# Patient Record
Sex: Male | Born: 1965 | Race: White | Hispanic: No | Marital: Married | Smoking: Never smoker
Health system: Southern US, Community
[De-identification: ages and names within clinical notes are randomized; demographics above are authoritative.]

## PROBLEM LIST (undated history)

## (undated) DIAGNOSIS — I10 Essential (primary) hypertension: Secondary | ICD-10-CM

## (undated) DIAGNOSIS — F419 Anxiety disorder, unspecified: Secondary | ICD-10-CM

## (undated) HISTORY — DX: Essential (primary) hypertension: I10

## (undated) HISTORY — DX: Anxiety disorder, unspecified: F41.9

---

## 2005-10-24 ENCOUNTER — Ambulatory Visit (HOSPITAL_COMMUNITY): Admission: RE | Admit: 2005-10-24 | Discharge: 2005-10-24 | Payer: Self-pay | Admitting: Family Medicine

## 2012-10-20 DIAGNOSIS — S381XXA Crushing injury of abdomen, lower back, and pelvis, initial encounter: Secondary | ICD-10-CM | POA: Insufficient documentation

## 2012-10-20 DIAGNOSIS — I10 Essential (primary) hypertension: Secondary | ICD-10-CM | POA: Insufficient documentation

## 2012-10-20 DIAGNOSIS — S32519A Fracture of superior rim of unspecified pubis, initial encounter for closed fracture: Secondary | ICD-10-CM | POA: Insufficient documentation

## 2012-10-20 DIAGNOSIS — S329XXA Fracture of unspecified parts of lumbosacral spine and pelvis, initial encounter for closed fracture: Secondary | ICD-10-CM | POA: Insufficient documentation

## 2012-10-20 DIAGNOSIS — R739 Hyperglycemia, unspecified: Secondary | ICD-10-CM | POA: Insufficient documentation

## 2012-10-20 DIAGNOSIS — E872 Acidosis, unspecified: Secondary | ICD-10-CM | POA: Insufficient documentation

## 2012-10-20 DIAGNOSIS — E876 Hypokalemia: Secondary | ICD-10-CM | POA: Insufficient documentation

## 2012-10-20 DIAGNOSIS — R58 Hemorrhage, not elsewhere classified: Secondary | ICD-10-CM | POA: Insufficient documentation

## 2012-10-20 DIAGNOSIS — S32309A Unspecified fracture of unspecified ilium, initial encounter for closed fracture: Secondary | ICD-10-CM | POA: Insufficient documentation

## 2012-10-20 DIAGNOSIS — D72829 Elevated white blood cell count, unspecified: Secondary | ICD-10-CM | POA: Insufficient documentation

## 2012-10-20 DIAGNOSIS — S7292XA Unspecified fracture of left femur, initial encounter for closed fracture: Secondary | ICD-10-CM | POA: Insufficient documentation

## 2012-10-20 DIAGNOSIS — F419 Anxiety disorder, unspecified: Secondary | ICD-10-CM | POA: Insufficient documentation

## 2012-10-20 DIAGNOSIS — S32599A Other specified fracture of unspecified pubis, initial encounter for closed fracture: Secondary | ICD-10-CM | POA: Insufficient documentation

## 2016-09-10 ENCOUNTER — Ambulatory Visit (INDEPENDENT_AMBULATORY_CARE_PROVIDER_SITE_OTHER): Payer: BLUE CROSS/BLUE SHIELD | Admitting: Physician Assistant

## 2016-09-10 ENCOUNTER — Encounter: Payer: Self-pay | Admitting: Physician Assistant

## 2016-09-10 VITALS — BP 130/84 | HR 73 | Temp 98.3°F | Resp 14 | Ht 75.0 in | Wt 194.6 lb

## 2016-09-10 DIAGNOSIS — F411 Generalized anxiety disorder: Secondary | ICD-10-CM | POA: Diagnosis not present

## 2016-09-10 DIAGNOSIS — Z7689 Persons encountering health services in other specified circumstances: Secondary | ICD-10-CM | POA: Diagnosis not present

## 2016-09-10 MED ORDER — ALPRAZOLAM 0.5 MG PO TABS: 0.5000 mg | ORAL_TABLET | Freq: Three times a day (TID) | ORAL | 2 refills | Status: DC

## 2016-09-10 NOTE — Progress Notes (Signed)
    Patient ID: Mathew Larson MRN: [id number], DOB: 08/08/1965, 51 y.o. Date of Encounter: 09/10/2016, 11:12 AM    Chief Complaint:  Chief Complaint  Patient presents with  . New Patient (Initial Visit)     HPI: 51 y.o. year old male presents as a new patient to Establish Care.   States that he has lived in the area for over 20 years. States that he was seeing Dr. Regin[facility]ough at [facility]. Dr Regino SchultzeMcGough recently retired. Says that the replacement Dr. his already left. Had been trying for months to get an appointment there. Finally decided to just change and start coming here.  States that the only medicine he is on is alprazolam. Says they would give him #90+2 refills and that would last him a full year. Says that he really only takes a half of a tablet daily. Occasionally will take that half a tablet twice a day.  Is on no other medication.  Would go there once a year for CPE and refill on med. 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  Last CPE sometime last "Spring" but not sure what month.  No other concerns to address today.     Home Meds:   No outpatient prescriptions prior to visit.   No facility-administered medications prior to visit.     Allergies: No Known Allergies    Review of Systems: See HPI for pertinent ROS. All other ROS negative.    Physical Exam: Blood pressure 130/84, pulse 73, temperature 98.3 F (36.8 C), temperature source Oral, resp. rate 14, height 6\' 3"  (1.905 m), weight 194 lb 9.6 oz (88.3 kg), SpO2 98 %., Body mass index is 24.32 kg/m. General:  WNWD Male. Appears in no acute distress. Neck: Supple. No thyromegaly. No lymphadenopathy. Lungs: Clear bilaterally to auscultation without wheezes, rales, or rhonchi. Breathing is unlabored. Heart: Regular rhythm. No murmurs, rubs, or gallops. Msk:  Strength and tone normal for age. Extremities/Skin: Warm and dry. Neuro:  Alert and oriented X 3. Moves all extremities spontaneously. Gait is normal. CNII-XII grossly in tact. Psych:  Responds to questions appropriately with a normal affect.     ASSESSMENT AND PLAN:  51 y.o. year old male with  1. Generalized anxiety disorder Rx printed. - ALPRAZolam (XANAX) 0.5 MG tablet; Take 1 tablet (0.5 mg total) by mouth 3 (three) times daily.  Dispense: 90 tablet; Refill: 2  He wants to have his records sent here so he did sign a release today to do so. He will check his records and then once it has been a full year, will schedule complete physical exam.  Signed, Frazier RichardsMary Beth Shaela Boer, [place], [facility] 09/10/2016 11:12 AM

## 2018-02-18 ENCOUNTER — Other Ambulatory Visit: Payer: Self-pay

## 2018-02-18 ENCOUNTER — Ambulatory Visit (INDEPENDENT_AMBULATORY_CARE_PROVIDER_SITE_OTHER): Payer: Managed Care, Other (non HMO) | Admitting: Physician Assistant

## 2018-02-18 ENCOUNTER — Encounter: Payer: Self-pay | Admitting: Physician Assistant

## 2018-02-18 VITALS — BP 142/100 | HR 77 | Temp 98.0°F | Resp 16 | Ht 75.0 in | Wt 199.4 lb

## 2018-02-18 DIAGNOSIS — F411 Generalized anxiety disorder: Secondary | ICD-10-CM | POA: Diagnosis not present

## 2018-02-18 DIAGNOSIS — I10 Essential (primary) hypertension: Secondary | ICD-10-CM | POA: Diagnosis not present

## 2018-02-18 DIAGNOSIS — Z0000 Encounter for general adult medical examination without abnormal findings: Secondary | ICD-10-CM | POA: Diagnosis not present

## 2018-02-18 MED ORDER — ALPRAZOLAM 0.5 MG PO TABS: 0.5000 mg | ORAL_TABLET | Freq: Three times a day (TID) | ORAL | 2 refills | Status: DC

## 2018-02-18 MED ORDER — LISINOPRIL 10 MG PO TABS: 10.0000 mg | ORAL_TABLET | Freq: Every day | ORAL | 0 refills | Status: DC

## 2018-02-18 NOTE — Progress Notes (Signed)
Patient ID: Mathew Larson MRN: [id number], DOB: 09-30-1965 52 y.o. Date of Encounter: 02/18/2018, 11:31 AM    Chief Complaint: Physical (CPE)   09/10/2016:  HPI: 52 y.o. year old male presents as a new patient to Establish Care.   States that he has lived in the area for over 20 years. States that he was seeing Dr. Regin[facility]Mc[facility]ough at [facility]. Dr Regino SchultzeMcGough recently retired. Says that the replacement Dr. his already left. Had been trying for months to get an appointment there. Finally decided to just change and start coming here.  States that the only medicine he is on is alprazolam. Says they would give him #90+2 refills and that would last him a full year. Says that he really only takes a half of a tablet daily. Occasionally will take that half a tablet twice a day.  Is on no other medication.  Would go there once a year for CPE and refill on med. 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  Last CPE sometime last "Spring" but not sure what month.  No other concerns to address today.    02/18/2018: He presents for routine follow-up visit regarding his anxiety and to get refill on his alprazolam. During visit -- he notes that nurse reported that his blood pressure is reading high today. He states that in the past sometimes his blood pressure would read high-- but then at recheck it would be lower. Also reports that in the past at one point he was on a blood pressure medication but it made him feel bad so it was discontinued.  Says that he has no idea the name of the medicine.   Says that he just remembers that it made him feel like he was in a fog and "just did not feel good--- in general." He is concerned about his blood pressure reading high and wants to go ahead and start on a low dose medication.  He then notes that he does have a lot of stress and in general feels like he is a stressed person.   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   Says that  "with that work it seems like if anything can go wrong, that it does ".   Also states that he is having to deal with his father getting older.   Says that he went through this with his wife caring for her parents but they have now passed.   Says now it is his turn to take care of his father.  Says that it is stressful to see that things are not being done the way they should be and also notes that frequently his father will not listen to him and is not compliant with his trying to be helpful.  States that the alprazolam is working well.  Says that he takes that each morning and then occasionally needs to take it later in the day as well.  Also notes that he came fasting today so that we could maybe check labs etc. Reviewed with him that I never got records from his prior provider but definitely has been over a  year since last CPE so he wants to go ahead and update preventive care while he is here.  No other specific concerns to address.       Review of Systems: Consitutional: No fever, chills, fatigue, night sweats, lymphadenopathy, or weight changes.  Eyes: No visual changes, eye redness, or discharge. ENT/Mouth: Ears: No otalgia, tinnitus, hearing loss, discharge. Nose: No congestion, rhinorrhea, sinus pain, or epistaxis. Throat: No sore throat, post nasal drip, or teeth pain. Cardiovascular: No CP, palpitations, diaphoresis, DOE, edema, orthopnea, PND. Respiratory: No cough, hemoptysis, SOB, or wheezing. Gastrointestinal: No anorexia, dysphagia, reflux, pain, nausea, vomiting, hematemesis, diarrhea, constipation, BRBPR, or melena. Genitourinary: No dysuria, frequency, urgency, hematuria, incontinence, nocturia, decreased urinary stream, discharge, impotence, or testicular pain/masses. Musculoskeletal: No decreased ROM, myalgias, stiffness, joint swelling, or weakness. Skin: No rash, erythema, lesion changes, pain, warmth, jaundice, or  pruritis. Neurological: No headache, dizziness, syncope, seizures, tremors, memory loss, coordination problems, or paresthesias. Psychological: No depression, hallucinations, SI/HI. Endocrine: No fatigue, polydipsia, polyphagia, polyuria, or known diabetes. All other systems were reviewed and are otherwise negative.  Past Medical History:  Diagnosis Date  . Anxiety   . Hypertension      History reviewed. No pertinent surgical history.  Home Meds:  Outpatient Medications Prior to Visit  Medication Sig Dispense Refill  . ALPRAZolam (XANAX) 0.5 MG tablet Take 1 tablet (0.5 mg total) by mouth 3 (three) times daily. 90 tablet 2   No facility-administered medications prior to visit.     Allergies: No Known Allergies  Social History   Socioeconomic History  . Marital status: Married    Spouse name: Not on file  . Number of children: Not on file  . Years of education: Not on file  . Highest education level: Not on file  Occupational History  . Not on file  Social Needs  . Financial resource strain: Not on file  . Food insecurity:    Worry: Not on file    Inability: Not on file  . Transportation needs:    Medical: Not on file    Non-medical: Not on file  Tobacco Use  . Smoking status: Never Smoker  . Smokeless tobacco: Never Used  Substance and Sexual Activity  . Alcohol use: No  . Drug use: No  . Sexual activity: Yes  Lifestyle  . Physical activity:    Days per week: Not on file    Minutes per session: Not on file  . Stress: Not on file  Relationships  . Social connections:    Talks on phone: Not on file    Gets together: Not on file    Attends religious service: Not on file    Active member of club or organization: Not on file    Attends meetings of clubs or organizations: Not on file    Relationship status: Not on file  . Intimate partner violence:    Fear of current or ex partner: Not on file    Emotionally abused: Not on file    Physically abused: Not on  file    Forced sexual activity: Not on file  Other Topics Concern  . Not on file  Social History Narrative  . Not on file    Family History  Problem Relation Age of Onset  . Alcohol abuse Mother   . Cancer Paternal Grandfather     Physical Exam: Blood pressure (!) 142/100, pulse 77, temperature 98 F (36.7 C), temperature source Oral, resp. rate 16, height 6\' 3"  (1.905 m), weight 90.4 kg, SpO2 97 %.  General: Well developed, well nourished Male. Appears in no acute distress. HEENT: Normocephalic, atraumatic. Conjunctiva pink, sclera non-icteric. Pupils 2 mm constricting to 1 mm, round, regular, and equally reactive to light and accomodation. EOMI. Internal auditory canal clear.  TMs with good cone of light and without pathology. Nasal mucosa pink. Nares are without discharge. No sinus tenderness. Oral mucosa pink. Neck: Supple. Trachea midline. No thyromegaly. Full ROM. No lymphadenopathy. Lungs: Clear to auscultation bilaterally without wheezes, rales, or rhonchi. Breathing is of normal effort and unlabored. Cardiovascular: RRR with S1 S2. No murmurs, rubs, or gallops. Distal pulses 2+ symmetrically. No carotid or abdominal bruits. Abdomen: Soft, non-tender, non-distended with normoactive bowel sounds. No hepatosplenomegaly or masses. No rebound/guarding. No CVA tenderness. No hernias. Musculoskeletal: Full range of motion and 5/5 strength throughout.  Skin: Warm and moist without erythema, ecchymosis, wounds, or rash. Neuro: A+Ox3. CN II-XII grossly intact. Moves all extremities spontaneously. Full sensation throughout. Normal gait.  Psych:  Responds to questions appropriately with a normal affect.   Assessment/Plan:  52 y.o. y/o  male here for CPE   -1. Encounter for preventive health examination 02/18/2018: A. Screening Labs:  - CBC with Differential/Platelet - COMPLETE METABOLIC PANEL WITH GFR - Lipid panel  B. Screening For Prostate Cancer: No indication to require this  until age 60  C. Screening For Colorectal Cancer:  No indication to require this until age 35  D. Immunizations: Flu-----------------------N/A Tetanus------------- reports that he received a tetanus vaccine in 2014.  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 Pneumococcal---- He has no indication to require pneumonia vaccine until age 31 Shingrix---------- Will discuss at age 19    2. Generalized anxiety disorder 02/18/2018:Stable /controlled.  Continue alprazolam as needed.  3. Essential hypertension 02/18/2018:Blood pressure is elevated.  He reports that he has had history of elevated blood pressure in the past.  He is concerned about this and does want to go ahead and start low-dose blood pressure medication.  Will start lisinopril 10 mg once a day he is to take in the morning.  Is to call here if he thinks this is causing adverse effects.  Otherwise he will continue taking 1 each morning and return for follow-up visit in 2 weeks. - lisinopril (PRINIVIL,ZESTRIL) 10 MG tablet; Take 1 tablet (10 mg total) by mouth daily.  Dispense: 30 tablet; Refill: 0     Signed:   [address] [place], [place]  02/18/2018 11:31 AM

## 2018-02-19 LAB — CBC WITH DIFFERENTIAL/PLATELET
BASOS PCT: 0.7 %
Basophils Absolute: 32 cells/uL (ref 0–200)
EOS ABS: 99 {cells}/uL (ref 15–500)
Eosinophils Relative: 2.2 %
HCT: 44.2 % (ref 38.5–50.0)
Hemoglobin: 14.9 g/dL (ref 13.2–17.1)
Lymphs Abs: 1260 cells/uL (ref 850–3900)
MCH: 30.3 pg (ref 27.0–33.0)
MCHC: 33.7 g/dL (ref 32.0–36.0)
MCV: 90 fL (ref 80.0–100.0)
MPV: 11 fL (ref 7.5–12.5)
Monocytes Relative: 11.5 %
Neutro Abs: 2592 cells/uL (ref 1500–7800)
Neutrophils Relative %: 57.6 %
PLATELETS: 247 10*3/uL (ref 140–400)
RBC: 4.91 10*6/uL (ref 4.20–5.80)
RDW: 12.5 % (ref 11.0–15.0)
TOTAL LYMPHOCYTE: 28 %
WBC mixed population: 518 cells/uL (ref 200–950)
WBC: 4.5 10*3/uL (ref 3.8–10.8)

## 2018-02-19 LAB — COMPLETE METABOLIC PANEL WITH GFR
AG Ratio: 1.8 (calc) (ref 1.0–2.5)
ALKALINE PHOSPHATASE (APISO): 54 U/L (ref 40–115)
ALT: 18 U/L (ref 9–46)
AST: 16 U/L (ref 10–40)
Albumin: 4.2 g/dL (ref 3.6–5.1)
BUN: 16 mg/dL (ref 7–25)
CALCIUM: 9 mg/dL (ref 8.6–10.3)
CO2: 27 mmol/L (ref 20–32)
Chloride: 105 mmol/L (ref 98–110)
Creat: 0.98 mg/dL (ref 0.60–1.35)
GFR, EST NON AFRICAN AMERICAN: 90 mL/min/{1.73_m2} (ref >=60)
GFR, Est African American: 104 mL/min/{1.73_m2} (ref >=60)
Globulin: 2.4 g/dL (calc) (ref 1.9–3.7)
Glucose, Bld: 95 mg/dL (ref 65–99)
Potassium: 4.4 mmol/L (ref 3.5–5.3)
Sodium: 140 mmol/L (ref 135–146)
Total Bilirubin: 0.5 mg/dL (ref 0.2–1.2)
Total Protein: 6.6 g/dL (ref 6.1–8.1)

## 2018-02-19 LAB — LIPID PANEL
Cholesterol: 169 mg/dL (ref <200)
HDL: 42 mg/dL (ref >40)
LDL Cholesterol (Calc): 109 mg/dL (calc) — ABNORMAL HIGH
NON-HDL CHOLESTEROL (CALC): 127 mg/dL (ref <130)
Total CHOL/HDL Ratio: 4 (calc) (ref <5.0)
Triglycerides: 88 mg/dL (ref <150)

## 2018-03-24 ENCOUNTER — Encounter: Payer: Self-pay | Admitting: Physician Assistant

## 2018-03-24 ENCOUNTER — Ambulatory Visit (INDEPENDENT_AMBULATORY_CARE_PROVIDER_SITE_OTHER): Payer: Managed Care, Other (non HMO) | Admitting: Physician Assistant

## 2018-03-24 VITALS — BP 140/102 | HR 74 | Temp 98.0°F | Resp 16 | Ht 75.0 in | Wt 199.4 lb

## 2018-03-24 DIAGNOSIS — I10 Essential (primary) hypertension: Secondary | ICD-10-CM

## 2018-03-24 MED ORDER — LISINOPRIL 20 MG PO TABS: 20.0000 mg | ORAL_TABLET | Freq: Every day | ORAL | 0 refills | Status: DC

## 2018-03-24 NOTE — Progress Notes (Signed)
Patient ID: Mathew Larson MRN: [id number], DOB: May 08, 1966 52 y.o. Date of Encounter: 03/24/2018, 3:12 PM    Chief Complaint: Physical (CPE)   09/10/2016:  HPI: 52 y.o. year old male presents as a new patient to Establish Care.   States that he has lived in the area for over 20 years. States that he was seeing Dr. Regin[facility]ough at [facility]. Dr Regino SchultzeMcGough recently retired. Says that the replacement Dr. his already left. Had been trying for months to get an appointment there. Finally decided to just change and start coming here.  States that the only medicine he is on is alprazolam. Says they would give him #90+2 refills and that would last him a full year. Says that he really only takes a half of a tablet daily. Occasionally will take that half a tablet twice a day.  Is on no other medication.  Would go there once a year for CPE and refill on med. 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  Last CPE sometime last "Spring" but not sure what month.  No other concerns to address today.    02/18/2018: He presents for routine follow-up visit regarding his anxiety and to get refill on his alprazolam. During visit -- he notes that nurse reported that his blood pressure is reading high today. He states that in the past sometimes his blood pressure would read high-- but then at recheck it would be lower. Also reports that in the past at one point he was on a blood pressure medication but it made him feel bad so it was discontinued.  Says that he has no idea the name of the medicine.   Says that he just remembers that it made him feel like he was in a fog and "just did not feel good--- in general." He is concerned about his blood pressure reading high and wants to go ahead and start on a low dose medication.  He then notes that he does have a lot of stress and in general feels like he is a stressed person.   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   Says that  "with that work it seems like if anything can go wrong, that it does ".   Also states that he is having to deal with his father getting older.   Says that he went through this with his wife caring for her parents but they have now passed.   Says now it is his turn to take care of his father.  Says that it is stressful to see that things are not being done the way they should be and also notes that frequently his father will not listen to him and is not compliant with his trying to be helpful.  States that the alprazolam is working well.  Says that he takes that each morning and then occasionally needs to take it later in the day as well.  Also notes that he came fasting today so that we could maybe check labs etc. Reviewed with him that I never got records from his prior provider but definitely has been over a  year since last CPE so he wants to go ahead and update preventive care while he is here.  No other specific concerns to address.  AT THAT OV: Updated preventive care/CPE.  Check fasting labs. Started lisinopril 10 mg daily for hypertension.  Plan  for follow-up visit 2 weeks.     03/24/2018: Today he reports that he is taking the lisinopril 10 mg daily.  States that at first he was taking it in the morning but it was making him feel tired so he started taking it at night.  Is working well for him and is causing no adverse effects with taking it at night.     Review of Systems: Consitutional: No fever, chills, fatigue, night sweats, lymphadenopathy, or weight changes. Eyes: No visual changes, eye redness, or discharge. ENT/Mouth: Ears: No otalgia, tinnitus, hearing loss, discharge. Nose: No congestion, rhinorrhea, sinus pain, or epistaxis. Throat: No sore throat, post nasal drip, or teeth pain. Cardiovascular: No CP, palpitations, diaphoresis, DOE, edema, orthopnea, PND. Respiratory: No cough, hemoptysis, SOB, or  wheezing. Gastrointestinal: No anorexia, dysphagia, reflux, pain, nausea, vomiting, hematemesis, diarrhea, constipation, BRBPR, or melena. Genitourinary: No dysuria, frequency, urgency, hematuria, incontinence, nocturia, decreased urinary stream, discharge, impotence, or testicular pain/masses. Musculoskeletal: No decreased ROM, myalgias, stiffness, joint swelling, or weakness. Skin: No rash, erythema, lesion changes, pain, warmth, jaundice, or pruritis. Neurological: No headache, dizziness, syncope, seizures, tremors, memory loss, coordination problems, or paresthesias. Psychological: No depression, hallucinations, SI/HI. Endocrine: No fatigue, polydipsia, polyphagia, polyuria, or known diabetes. All other systems were reviewed and are otherwise negative.  Past Medical History:  Diagnosis Date  . Anxiety   . Hypertension      History reviewed. No pertinent surgical history.  Home Meds:  Outpatient Medications Prior to Visit  Medication Sig Dispense Refill  . ALPRAZolam (XANAX) 0.5 MG tablet Take 1 tablet (0.5 mg total) by mouth 3 (three) times daily. 90 tablet 2  . lisinopril (PRINIVIL,ZESTRIL) 10 MG tablet Take 1 tablet (10 mg total) by mouth daily. 30 tablet 0   No facility-administered medications prior to visit.     Allergies: No Known Allergies  Social History   Socioeconomic History  . Marital status: Married    Spouse name: Not on file  . Number of children: Not on file  . Years of education: Not on file  . Highest education level: Not on file  Occupational History  . Not on file  Social Needs  . Financial resource strain: Not on file  . Food insecurity:    Worry: Not on file    Inability: Not on file  . Transportation needs:    Medical: Not on file    Non-medical: Not on file  Tobacco Use  . Smoking status: Never Smoker  . Smokeless tobacco: Never Used  Substance and Sexual Activity  . Alcohol use: No  . Drug use: No  . Sexual activity: Yes  Lifestyle   . Physical activity:    Days per week: Not on file    Minutes per session: Not on file  . Stress: Not on file  Relationships  . Social connections:    Talks on phone: Not on file    Gets together: Not on file    Attends religious service: Not on file    Active member of club or organization: Not on file    Attends meetings of clubs or organizations: Not on file    Relationship status: Not on file  . Intimate partner violence:    Fear of current or ex partner: Not on file    Emotionally abused: Not on file    Physically abused: Not on file    Forced sexual activity: Not on file  Other Topics Concern  . Not on file  Social History  Narrative  . Not on file    Family History  Problem Relation Age of Onset  . Alcohol abuse Mother   . Cancer Paternal Grandfather     Physical Exam: Blood pressure (!) 140/102, pulse 74, temperature 98 F (36.7 C), temperature source Oral, resp. rate 16, height 6\' 3"  (1.905 m), weight 90.4 kg, SpO2 97 %., Body mass index is 24.92 kg/m. General:  WNWD Male. Appears in no acute distress. Neck: Supple. No thyromegaly. No lymphadenopathy.  No carotid bruits. Lungs: Clear bilaterally to auscultation without wheezes, rales, or rhonchi. Breathing is unlabored. Heart: RRR with S1 S2. No murmurs, rubs, or gallops. Musculoskeletal:  Strength and tone normal for age. Extremities/Skin: Warm and dry.  Neuro: Alert and oriented X 3. Moves all extremities spontaneously. Gait is normal. CNII-XII grossly in tact. Psych:  Responds to questions appropriately with a normal affect.      Assessment/Plan:  52 y.o. y/o  male here for    Essential hypertension 02/18/2018:Blood pressure is elevated.  He reports that he has had history of elevated blood pressure in the past.  He is concerned about this and does want to go ahead and start low-dose blood pressure medication.  Will start lisinopril 10 mg once a day he is to take in the morning.  Is to call here if he  thinks this is causing adverse effects.  Otherwise he will continue taking 1 each morning and return for follow-up visit in 2 weeks. - lisinopril (PRINIVIL,ZESTRIL) 10 MG tablet; Take 1 tablet (10 mg total) by mouth daily.  Dispense: 30 tablet; Refill: 0  03/24/2018: Nurse checked blood pressure on each arm and got similar readings bilaterally.   I rechecked his blood pressure on the left arm and I am getting the systolic better at about 134 but the diastolic is reading high at about 104. Will increase lisinopril to 20 mg daily.   Will have him continue to take it at night since he felt that he had increased fatigue when he would take it in the morning.   He will have follow-up visit in 2 weeks to recheck BP and also will check BME T at that visit.   Hopefully BP will be at goal and can stretch out visits to every 6 months.   He states that he "is not surprised" that blood pressure is still high.   States that his blood pressure was high with Dr. Regino Schultze even way back around 2005.    Generalized anxiety disorder 02/18/2018:Stable /controlled.  Continue alprazolam as needed.    THE FOLLOWING IS COPIED FROM CPE NOTE 02/18/2018: -1. Encounter for preventive health examination 02/18/2018: A. Screening Labs:  - CBC with Differential/Platelet - COMPLETE METABOLIC PANEL WITH GFR - Lipid panel  B. Screening For Prostate Cancer: No indication to require this until age 29  C. Screening For Colorectal Cancer:  No indication to require this until age 3  D. Immunizations: Flu-----------------------N/A Tetanus------------- reports that he received a tetanus vaccine in 2014.  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 Pneumococcal---- He has no indication to require pneumonia vaccine until age 38 Shingrix---------- Will discuss at age 50      Signed:   [address] [place], [place]  03/24/2018 3:12 PM

## 2018-09-06 ENCOUNTER — Ambulatory Visit (INDEPENDENT_AMBULATORY_CARE_PROVIDER_SITE_OTHER): Payer: Managed Care, Other (non HMO)

## 2018-09-06 DIAGNOSIS — Z23 Encounter for immunization: Secondary | ICD-10-CM

## 2018-09-06 NOTE — Progress Notes (Signed)
Patient came in today to receive his annual flu shot. Patient was given fluarix in the left deltoid. Patient tolerated well. VIS was given.

## 2019-04-11 ENCOUNTER — Other Ambulatory Visit: Payer: Self-pay

## 2019-04-11 ENCOUNTER — Encounter: Payer: Self-pay | Admitting: Family Medicine

## 2019-04-11 ENCOUNTER — Ambulatory Visit (INDEPENDENT_AMBULATORY_CARE_PROVIDER_SITE_OTHER): Payer: Managed Care, Other (non HMO) | Admitting: Family Medicine

## 2019-04-11 VITALS — BP 146/82 | HR 90 | Temp 98.1°F | Resp 16 | Ht 75.0 in | Wt 198.0 lb

## 2019-04-11 DIAGNOSIS — Z23 Encounter for immunization: Secondary | ICD-10-CM | POA: Diagnosis not present

## 2019-04-11 DIAGNOSIS — Z125 Encounter for screening for malignant neoplasm of prostate: Secondary | ICD-10-CM | POA: Diagnosis not present

## 2019-04-11 DIAGNOSIS — I10 Essential (primary) hypertension: Secondary | ICD-10-CM | POA: Diagnosis not present

## 2019-04-11 DIAGNOSIS — F411 Generalized anxiety disorder: Secondary | ICD-10-CM | POA: Diagnosis not present

## 2019-04-11 MED ORDER — AMLODIPINE BESYLATE 2.5 MG PO TABS: 2.5000 mg | ORAL_TABLET | Freq: Every day | ORAL | 3 refills | Status: DC

## 2019-04-11 NOTE — Progress Notes (Signed)
Subjective:    Patient ID: Mathew Larson, male    DOB: April 26, 1966, 53 y.o.   MRN: [id number]  Patient presents for Follow-up (is fsating) Patient here to follow-up chronic medical problems.  This is my first time establishing care with him he is a previous patient of mine PA.  History of hypertension has had this for greater than 20 years.  Often thought it was more induced by his anxiety.  He was initially given medication last year lisinopril dose was titrated up to 20 mg but he states that it did not work well for his body he felt uncomfortable with the medication felt lightheaded and stuffy headache.  So he discontinued it.  He has not been on any other medications since then. 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  He is not able to exercise because of this.  He does try to watch his diet he does not add any salt to the cooking wife is also in the appointment.  They also do not eat any junk food or fast food.  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  During the anxiety he has had this for many years.  He is prescribed 0.5 mg of Xanax 3 times daily as needed he typically spread this out over a few months at a time.  When he does not take medication often only takes a half a tablet     No major issues with sleep     Dentist- every 6 months    Occasion history reviewed in detail  Review Of Systems:  GEN- denies fatigue, fever, weight loss,weakness, recent illness HEENT- denies eye drainage, change in vision, nasal discharge, CVS- denies chest pain, palpitations RESP- denies SOB, cough, wheeze ABD- denies N/V, change in stools, abd pain GU- denies dysuria, hematuria, dribbling, incontinence MSK- denies joint pain, muscle aches, injury Neuro- denies headache, dizziness, syncope, seizure activity       Objective:    BP (!) 146/82 (BP Location: Left Arm, Patient Position: Sitting,  Cuff Size: Normal)   Pulse 90   Temp 98.1 F (36.7 C) (Oral)   Resp 16   Ht 6\' 3"  (1.905 m)   Wt 198 lb (89.8 kg)   SpO2 98%   BMI 24.75 kg/m  GEN- NAD, alert and oriented x3 HEENT- PERRL, EOMI, non injected sclera, pink conjunctiva, MMM, oropharynx clear Neck- Supple, no thyromegaly CVS- RRR, no murmur RESP-CTAB ABD-NABS,soft,NT,ND Psych- normal affect and mood  EXT- No edema Pulses- Radial, DP- 2+        Assessment & Plan:      Problem List Items Addressed This Visit      Unprioritized   Essential hypertension - Primary    Will start amlodipine 2.5 mg once a day.  Of note also advised him to take his blood pressure to make sure that he is not having hypotensive episodes when he states he did not feel right with the previous medication.  I am to check his metabolic panel electrolytes.  He is also overdue for PSA screening so this will be done.  Flu shot was given.      Relevant Medications   amLODipine (NORVASC) 2.5 MG tablet   Other Relevant Orders   CBC with Differential/Platelet (Completed)   Comprehensive metabolic panel (Completed)   Lipid panel (Completed)   Generalized anxiety disorder    We will continue  with Xanax at this time.  We will recheck his blood pressure in about 1 to 2 months      Relevant Medications   ALPRAZolam (XANAX) 0.5 MG tablet    Other Visit Diagnoses    Prostate cancer screening       Relevant Orders   PSA (Completed)   Need for immunization against influenza       Relevant Orders   Flu Vaccine QUAD 36+ mos IM (Completed)      Note: This dictation was prepared with Dragon dictation along with smaller phrase technology. Any transcriptional errors that result from this process are unintentional.

## 2019-04-11 NOTE — Patient Instructions (Addendum)
Started norvasc 2.5mg  once a day for blood pressure  We will call with lab results Flu shot given  Cologuard  F/U 6 WEEKS FOR BLOOD PRESSURE

## 2019-04-12 ENCOUNTER — Encounter: Payer: Self-pay | Admitting: Family Medicine

## 2019-04-12 LAB — COMPREHENSIVE METABOLIC PANEL
AG Ratio: 1.8 (calc) (ref 1.0–2.5)
ALT: 28 U/L (ref 9–46)
AST: 19 U/L (ref 10–35)
Albumin: 4.5 g/dL (ref 3.6–5.1)
Alkaline phosphatase (APISO): 53 U/L (ref 35–144)
BUN: 14 mg/dL (ref 7–25)
CO2: 28 mmol/L (ref 20–32)
Calcium: 9.3 mg/dL (ref 8.6–10.3)
Chloride: 104 mmol/L (ref 98–110)
Creat: 0.96 mg/dL (ref 0.70–1.33)
Globulin: 2.5 g/dL (calc) (ref 1.9–3.7)
Glucose, Bld: 94 mg/dL (ref 65–99)
Potassium: 4.5 mmol/L (ref 3.5–5.3)
Sodium: 141 mmol/L (ref 135–146)
Total Bilirubin: 0.6 mg/dL (ref 0.2–1.2)
Total Protein: 7 g/dL (ref 6.1–8.1)

## 2019-04-12 LAB — CBC WITH DIFFERENTIAL/PLATELET
Absolute Monocytes: 602 cells/uL (ref 200–950)
Basophils Absolute: 32 cells/uL (ref 0–200)
Basophils Relative: 0.5 %
Eosinophils Absolute: 109 cells/uL (ref 15–500)
Eosinophils Relative: 1.7 %
HCT: 45.5 % (ref 38.5–50.0)
Hemoglobin: 15.3 g/dL (ref 13.2–17.1)
Lymphs Abs: 1318 cells/uL (ref 850–3900)
MCH: 30.4 pg (ref 27.0–33.0)
MCHC: 33.6 g/dL (ref 32.0–36.0)
MCV: 90.3 fL (ref 80.0–100.0)
MPV: 10.8 fL (ref 7.5–12.5)
Monocytes Relative: 9.4 %
Neutro Abs: 4339 cells/uL (ref 1500–7800)
Neutrophils Relative %: 67.8 %
Platelets: 258 10*3/uL (ref 140–400)
RBC: 5.04 10*6/uL (ref 4.20–5.80)
RDW: 12.9 % (ref 11.0–15.0)
Total Lymphocyte: 20.6 %
WBC: 6.4 10*3/uL (ref 3.8–10.8)

## 2019-04-12 LAB — LIPID PANEL
Cholesterol: 191 mg/dL (ref <200)
HDL: 48 mg/dL (ref >=40)
LDL Cholesterol (Calc): 122 mg/dL (calc) — ABNORMAL HIGH
Non-HDL Cholesterol (Calc): 143 mg/dL (calc) — ABNORMAL HIGH (ref <130)
Total CHOL/HDL Ratio: 4 (calc) (ref <5.0)
Triglycerides: 106 mg/dL (ref <150)

## 2019-04-12 LAB — PSA: PSA: 1.4 ng/mL (ref <=4.0)

## 2019-04-12 MED ORDER — ALPRAZOLAM 0.5 MG PO TABS: 0.5000 mg | ORAL_TABLET | Freq: Three times a day (TID) | ORAL | 2 refills | Status: DC | PRN

## 2019-04-12 NOTE — Assessment & Plan Note (Signed)
Will start amlodipine 2.5 mg once a day.  Of note also advised him to take his blood pressure to make sure that he is not having hypotensive episodes when he states he did not feel right with the previous medication.  I am to check his metabolic panel electrolytes.  He is also overdue for PSA screening so this will be done.  Flu shot was given.

## 2019-04-12 NOTE — Assessment & Plan Note (Signed)
We will continue with Xanax at this time.  We will recheck his blood pressure in about 1 to 2 months

## 2019-04-14 ENCOUNTER — Other Ambulatory Visit: Payer: Self-pay | Admitting: *Deleted

## 2019-04-14 ENCOUNTER — Telehealth: Payer: Self-pay | Admitting: *Deleted

## 2019-04-14 DIAGNOSIS — E7800 Pure hypercholesterolemia, unspecified: Secondary | ICD-10-CM

## 2019-04-14 NOTE — Telephone Encounter (Signed)
Received verbal orders for Cologuard.   Order placed via [facility].   Cologuard (Order [id number])

## 2019-05-09 ENCOUNTER — Encounter: Payer: Self-pay | Admitting: Family Medicine

## 2019-05-18 LAB — COLOGUARD: Cologuard: NEGATIVE

## 2019-05-19 NOTE — Telephone Encounter (Signed)
Received the results of Cologuard screening.   Screening is negative. Call placed to patient and patient made aware.   A negative result indicates a low likelihood of colorectal cancer is present. Following a negative Cologuard result, the [employer] recommends a Cologuard re-screening interval of 3 years.    

## 2019-05-20 ENCOUNTER — Other Ambulatory Visit: Payer: Self-pay

## 2019-05-23 ENCOUNTER — Encounter: Payer: Self-pay | Admitting: Family Medicine

## 2019-05-23 ENCOUNTER — Ambulatory Visit (INDEPENDENT_AMBULATORY_CARE_PROVIDER_SITE_OTHER): Payer: Managed Care, Other (non HMO) | Admitting: Family Medicine

## 2019-05-23 ENCOUNTER — Other Ambulatory Visit: Payer: Self-pay

## 2019-05-23 VITALS — BP 142/94 | HR 82 | Temp 98.0°F | Resp 14 | Ht 75.0 in | Wt 195.0 lb

## 2019-05-23 DIAGNOSIS — I10 Essential (primary) hypertension: Secondary | ICD-10-CM | POA: Diagnosis not present

## 2019-05-23 MED ORDER — AMLODIPINE BESYLATE 2.5 MG PO TABS: 2.5000 mg | ORAL_TABLET | Freq: Every day | ORAL | 1 refills | Status: DC

## 2019-05-23 NOTE — Patient Instructions (Addendum)
F/U 6 months  Take extra norvasc 2.5  if BP is > 140/90

## 2019-05-23 NOTE — Progress Notes (Signed)
   Subjective:    Patient ID: Mathew Larson, male    DOB: 05/07/1966, 53 y.o.   MRN: [id number]  Patient presents for Follow-up (is not fasting)  Patient here for interim follow-up on his hypertension.  He was seen a month ago in October at that time he was started on amlodipine 2.5 mg once a day as he states that he is very sensitive to medications and often has side effects.  He also has underlying anxiety which he takes alprazolam 0.5 mg up to 3 times a day  He states that he actually started with taking a half a tablet of the amlodipine to make sure he did not have any side effects and worked up to 1 tablet daily which he takes around 1130.  He denies any side effects with the medication.  He states that home BP runs 135-138/85    We also reviewed his recent labs.  Was noted that his LDL was elevated at 122 discussed dietary changes.  His PSA metabolic and CBC were normal.  Also states that he has cut out late night eating and has changed his diet in order to help his cholesterol and his blood pressure. Review Of Systems:  GEN- denies fatigue, fever, weight loss,weakness, recent illness HEENT- denies eye drainage, change in vision, nasal discharge, CVS- denies chest pain, palpitations RESP- denies SOB, cough, wheeze ABD- denies N/V, change in stools, abd pain GU- denies dysuria, hematuria, dribbling, incontinence MSK- denies joint pain, muscle aches, injury Neuro- denies headache, dizziness, syncope, seizure activity       Objective:    BP (!) 142/94 (BP Location: Right Arm, Patient Position: Sitting, Cuff Size: Normal)   Pulse 82   Temp 98 F (36.7 C) (Temporal)   Resp 14   Ht 6\' 3"  (1.905 m)   Wt 195 lb (88.5 kg)   SpO2 96%   BMI 24.37 kg/m  GEN- NAD, alert and oriented x3 HEENT- PERRL, EOMI, non injected sclera, pink conjunctiva, MMM, oropharynx clear Neck- Supple, no thyromegaly CVS- RRR, no murmur RESP-CTAB Ext- no edema         Assessment & Plan:       Problem List Items Addressed This Visit      Unprioritized   Essential hypertension - Primary    Pressure is still elevated I think that he may have some component of whitecoat hypertension as well with his underlying anxiety.  His home readings are improved.  At this time we will continue to 2.5 mg of amlodipine.  If he noticed that his blood sugars pressures are greater than 140/90 at home he is can increase to 5 mg.  I think if he continues to lose weight and watch his diet that his blood pressures will settle down.  He may be able to come back off of the medication.  His labs were fairly unremarkable with exception of mild hyperlipidemia which he will come back in a few months to have this rechecked.      Relevant Medications   amLODipine (NORVASC) 2.5 MG tablet      Note: This dictation was prepared with Dragon dictation along with smaller phrase technology. Any transcriptional errors that result from this process are unintentional.

## 2019-05-23 NOTE — Assessment & Plan Note (Signed)
Pressure is still elevated I think that he may have some component of whitecoat hypertension as well with his underlying anxiety.  His home readings are improved.  At this time we will continue to 2.5 mg of amlodipine.  If he noticed that his blood sugars pressures are greater than 140/90 at home he is can increase to 5 mg.  I think if he continues to lose weight and watch his diet that his blood pressures will settle down.  He may be able to come back off of the medication.  His labs were fairly unremarkable with exception of mild hyperlipidemia which he will come back in a few months to have this rechecked.

## 2019-10-13 ENCOUNTER — Other Ambulatory Visit: Payer: Self-pay

## 2019-10-13 ENCOUNTER — Ambulatory Visit: Payer: Self-pay | Attending: Family Medicine

## 2019-10-13 DIAGNOSIS — Z23 Encounter for immunization: Secondary | ICD-10-CM

## 2019-10-13 NOTE — Progress Notes (Signed)
   Covid-19 Vaccination Clinic  Name:  Mathew Larson    MRN: [id number] DOB: [birth year]  10/13/2019  Mr. Peer was observed post Covid-19 immunization for 15 minutes without incident. He was provided with Vaccine Information Sheet and instruction to access the V-Safe system.   [name] was instructed to call 911 with any severe reactions post vaccine: Marland Kitchen Difficulty breathing  . Swelling of face and throat  . A fast heartbeat  . A bad rash all over body  . Dizziness and weakness   Immunizations Administered    Name Date Dose VIS Date Route   Moderna COVID-19 Vaccine 10/13/2019 12:00 PM 0.5 mL 06/07/2019 Intramuscular   Manufacturer: Moderna   Lot: [id number]   NDC: 43837-793-96

## 2019-11-15 ENCOUNTER — Ambulatory Visit: Payer: Self-pay | Attending: Internal Medicine

## 2019-11-15 DIAGNOSIS — Z23 Encounter for immunization: Secondary | ICD-10-CM

## 2019-11-15 NOTE — Progress Notes (Signed)
   Covid-19 Vaccination Clinic  Name:  Mathew Larson    MRN: [id number] DOB: [birth year]  11/15/2019  [name] was observed post Covid-19 immunization for 15 minutes without incident. He was provided with Vaccine Information Sheet and instruction to access the V-Safe system.   [name] was instructed to call 911 with any severe reactions post vaccine: Marland Kitchen Difficulty breathing  . Swelling of face and throat  . A fast heartbeat  . A bad rash all over body  . Dizziness and weakness   Immunizations Administered    Name Date Dose VIS Date Route   Moderna COVID-19 Vaccine 11/15/2019 12:09 PM 0.5 mL 06/2019 Intramuscular   Manufacturer: Moderna   Lot: [id number]   NDC: 48270-786-75

## 2019-11-16 ENCOUNTER — Ambulatory Visit: Payer: Self-pay

## 2020-05-30 ENCOUNTER — Ambulatory Visit (INDEPENDENT_AMBULATORY_CARE_PROVIDER_SITE_OTHER): Payer: Managed Care, Other (non HMO)

## 2020-05-30 ENCOUNTER — Other Ambulatory Visit: Payer: Self-pay

## 2020-05-30 VITALS — BP 120/80 | Temp 98.6°F | Ht 75.0 in | Wt 189.0 lb

## 2020-05-30 DIAGNOSIS — Z23 Encounter for immunization: Secondary | ICD-10-CM

## 2020-11-27 NOTE — Progress Notes (Signed)
Subjective:    Patient ID: Mathew Larson, male    DOB: 22-Jan-1966, 55 y.o.   MRN: [id number]  HPI: Jabree Rebert is a 55 y.o. male presenting for medication refill.  Chief Complaint  Patient presents with  . Medication Refill    Alprazolam refill, asking for yr supply, takes prn. Does not take bp med anymore   HYPERTENSION Patient reports history of hypertension.  Previously, blood pressure was much higher, sometimes around 180 systolic.  Not currently taking anything for blood pressure.  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 Hypertension status: stable  Satisfied with current treatment? Not currently on treatment Duration of hypertension: chronic BP monitoring frequency:  daily BP range:  134/80 BP medication side effects:  Not currently on treatment Medication compliance: Not currently taking medicine Aspirin: no Recurrent headaches: no Visual changes: no Palpitations: no Dyspnea: no Chest pain: no Lower extremity edema: no Dizzy/lightheaded: no    ANXIETY/STRESS Takes alprazolam every day.  Currently, taking 1/2 tablet daily.  Sometimes, takes a whole tablet, usually of day is going to be stressful.  Always takes first thing in the morning.   Duration: chronic Anxious mood: yes  Excessive worrying: yes Irritability: no  Sweating: no Nausea: no Palpitations:yes Hyperventilation: no Panic attacks: no Agoraphobia: no  Obscessions/compulsions: no Depressed mood: no Depression screen [facility] 2/9 11/28/2020 04/11/2019 02/18/2018 09/10/2016  Decreased Interest 0 0 0 0  Down, Depressed, Hopeless 0 0 0 0  PHQ - 2 Score 0 0 0 0  Altered sleeping 0 0 - 0  Tired, decreased energy 0 0 - 0  Change in appetite 0 0 - 0  Feeling bad or failure about yourself  0 0 - 0  Trouble concentrating 0 0 - 0  Moving slowly or fidgety/restless 0 0 - 0  Suicidal thoughts - 0 - 0  PHQ-9 Score 0 0 - 0  Difficult doing work/chores Not difficult at all Not difficult at  all - -    GAD 7 : Generalized Anxiety Score 11/28/2020  Nervous, Anxious, on Edge 1  Control/stop worrying 1  Worry too much - different things 0  Trouble relaxing 0  Restless 0  Easily annoyed or irritable 0  Afraid - awful might happen 0  Total GAD 7 Score 2  Anxiety Difficulty Not difficult at all    Anhedonia: no Weight changes: no Insomnia: no   Hypersomnia: no Fatigue/loss of energy: no Feelings of worthlessness: no Feelings of guilt: no Impaired concentration/indecisiveness: no Suicidal ideations: no  Crying spells: no Recent Stressors/Life Changes: yes   Relationship problems: no   Family stress: no     Financial stress: no    Job stress: yes    Recent death/loss: no  Allergies  Allergen Reactions  . Lisinopril     Fatigue, nausea    Outpatient Encounter Medications as of 11/28/2020  Medication Sig  . Multiple Vitamins-Minerals (AIRBORNE PO) Take by mouth.  . ALPRAZolam (XANAX) 0.5 MG tablet Take 1 tablet (0.5 mg total) by mouth daily as needed for anxiety.  . [DISCONTINUED] ALPRAZolam (XANAX) 0.5 MG tablet Take 1 tablet (0.5 mg total) by mouth 3 (three) times daily as needed for anxiety. (Patient not taking: Reported on 11/28/2020)  . [DISCONTINUED] amLODipine (NORVASC) 2.5 MG tablet Take 1 tablet (2.5 mg total) by mouth daily. (Patient not taking: Reported on 11/28/2020)   No facility-administered encounter medications on file as of 11/28/2020.    Patient Active Problem List   Diagnosis  Date Noted  . Essential hypertension 02/18/2018  . Generalized anxiety disorder 09/10/2016    Past Medical History:  Diagnosis Date  . Anxiety   . Hypertension     Relevant past medical, surgical, family and social history reviewed and updated as indicated. Interim medical history since our last visit reviewed.  Review of Systems Per HPI unless specifically indicated above     Objective:    BP 134/80   Pulse 88   Temp 97.8 F (36.6 C)   Ht 6\' 3"  (1.905 m)    Wt 189 lb (85.7 kg)   SpO2 98%   BMI 23.62 kg/m   Wt Readings from Last 3 Encounters:  11/28/20 189 lb (85.7 kg)  05/23/19 195 lb (88.5 kg)  04/11/19 198 lb (89.8 kg)    Physical Exam Vitals and nursing note reviewed.  Constitutional:      General: He is not in acute distress.    Appearance: Normal appearance. He is not toxic-appearing.  Eyes:     General: No scleral icterus. Cardiovascular:     Rate and Rhythm: Normal rate and regular rhythm.     Heart sounds: Normal heart sounds. No murmur heard.   Pulmonary:     Effort: Pulmonary effort is normal. No respiratory distress.     Breath sounds: Normal breath sounds. No wheezing, rhonchi or rales.  Skin:    General: Skin is warm and dry.     Coloration: Skin is not jaundiced or pale.     Findings: No erythema.  Neurological:     Mental Status: He is alert and oriented to person, place, and time.     Gait: Gait normal.  Psychiatric:        Mood and Affect: Mood normal.        Behavior: Behavior normal.        Thought Content: Thought content normal.        Judgment: Judgment normal.       Assessment & Plan:   Problem List Items Addressed This Visit      Cardiovascular and Mediastinum   Essential hypertension    Chronic, stable.  Stopped taking amlodipine because it was not making any difference in home blood pressure readings.  Discussed goal less than 130/80, however patient does not desire treatment for blood pressure today and "feels fine."  Will check kidney function with electrolytes and blood counts today.  Encouraged DASH diet and continue to monitor BP at home and notify 06/11/19 if consistently >130/80.        Relevant Orders   CBC with Differential   Lipid Panel   COMPLETE METABOLIC PANEL WITH GFR   Hemoglobin A1c     Other   Generalized anxiety disorder - Primary    Chronic.  Currently taking alprazolam 0.5 mg 1/2 tablet daily. Pt is aware of risks of psychoactive medication use to include increased  sedation, respiratory suppression, falls, extrapyramidal movements,  dependence and cardiovascular events.  Pt would like to continue treatment as benefit determined to outweigh risk.  GAD-7 and PHQ-9 today not elevated.  No SI/HI.  PDMP reviewed and appropriate - refill given today.  Controlled substance agreement signed today; discussed that refills will be given with office visit only.  Follow up in 3-6 months.       Relevant Medications   ALPRAZolam (XANAX) 0.5 MG tablet    Other Visit Diagnoses    BMI 23.0-23.9, adult       Relevant Orders   CBC  with Differential   Lipid Panel   COMPLETE METABOLIC PANEL WITH GFR   Hemoglobin A1c       Follow up plan: Return for 3-6 months.

## 2020-11-28 ENCOUNTER — Ambulatory Visit (INDEPENDENT_AMBULATORY_CARE_PROVIDER_SITE_OTHER): Payer: Managed Care, Other (non HMO) | Admitting: Nurse Practitioner

## 2020-11-28 ENCOUNTER — Other Ambulatory Visit: Payer: Self-pay

## 2020-11-28 ENCOUNTER — Encounter: Payer: Self-pay | Admitting: Nurse Practitioner

## 2020-11-28 VITALS — BP 134/80 | HR 88 | Temp 97.8°F | Ht 75.0 in | Wt 189.0 lb

## 2020-11-28 DIAGNOSIS — I10 Essential (primary) hypertension: Secondary | ICD-10-CM | POA: Diagnosis not present

## 2020-11-28 DIAGNOSIS — F411 Generalized anxiety disorder: Secondary | ICD-10-CM | POA: Diagnosis not present

## 2020-11-28 DIAGNOSIS — Z6823 Body mass index (BMI) 23.0-23.9, adult: Secondary | ICD-10-CM | POA: Diagnosis not present

## 2020-11-28 LAB — CBC WITH DIFFERENTIAL/PLATELET
HCT: 45.4 % (ref 38.5–50.0)
Lymphs Abs: 1565 cells/uL (ref 850–3900)
MCH: 30.3 pg (ref 27.0–33.0)
MCHC: 33.3 g/dL (ref 32.0–36.0)
Total Lymphocyte: 32.6 %

## 2020-11-28 MED ORDER — ALPRAZOLAM 0.5 MG PO TABS
0.5000 mg | ORAL_TABLET | Freq: Every day | ORAL | 0 refills | Status: DC | PRN
Start: 2020-11-28 — End: 2021-03-28

## 2020-11-28 NOTE — Patient Instructions (Signed)

## 2020-11-28 NOTE — Assessment & Plan Note (Addendum)
Chronic, stable.  Stopped taking amlodipine because it was not making any difference in home blood pressure readings.  Discussed goal less than 130/80, however patient does not desire treatment for blood pressure today and "feels fine."  Will check kidney function with electrolytes and blood counts today.  Encouraged DASH diet and continue to monitor BP at home and notify us if consistently >130/80.

## 2020-11-28 NOTE — Assessment & Plan Note (Signed)
Chronic.  Currently taking alprazolam 0.5 mg 1/2 tablet daily. Pt is aware of risks of psychoactive medication use to include increased sedation, respiratory suppression, falls, extrapyramidal movements,  dependence and cardiovascular events.  Pt would like to continue treatment as benefit determined to outweigh risk.  GAD-7 and PHQ-9 today not elevated.  No SI/HI.  PDMP reviewed and appropriate - refill given today.  Controlled substance agreement signed today; discussed that refills will be given with office visit only.  Follow up in 3-6 months.

## 2020-11-29 ENCOUNTER — Encounter: Payer: Self-pay | Admitting: Nurse Practitioner

## 2020-11-29 LAB — CBC WITH DIFFERENTIAL/PLATELET
Absolute Monocytes: 427 cells/uL (ref 200–950)
Basophils Absolute: 48 cells/uL (ref 0–200)
Basophils Relative: 1 %
Eosinophils Absolute: 82 cells/uL (ref 15–500)
Eosinophils Relative: 1.7 %
Hemoglobin: 15.1 g/dL (ref 13.2–17.1)
MCV: 91.2 fL (ref 80.0–100.0)
MPV: 10.8 fL (ref 7.5–12.5)
Monocytes Relative: 8.9 %
Neutro Abs: 2678 cells/uL (ref 1500–7800)
Neutrophils Relative %: 55.8 %
Platelets: 239 10*3/uL (ref 140–400)
RBC: 4.98 10*6/uL (ref 4.20–5.80)
RDW: 12.4 % (ref 11.0–15.0)
WBC: 4.8 10*3/uL (ref 3.8–10.8)

## 2020-11-29 LAB — COMPLETE METABOLIC PANEL WITH GFR
AG Ratio: 1.9 (calc) (ref 1.0–2.5)
ALT: 16 U/L (ref 9–46)
AST: 15 U/L (ref 10–35)
Albumin: 4.6 g/dL (ref 3.6–5.1)
Alkaline phosphatase (APISO): 59 U/L (ref 35–144)
BUN: 14 mg/dL (ref 7–25)
CO2: 31 mmol/L (ref 20–32)
Calcium: 9.5 mg/dL (ref 8.6–10.3)
Chloride: 103 mmol/L (ref 98–110)
Creat: 0.91 mg/dL (ref 0.70–1.33)
GFR, Est African American: 112 mL/min/{1.73_m2} (ref >=60)
GFR, Est Non African American: 97 mL/min/{1.73_m2} (ref >=60)
Globulin: 2.4 g/dL (calc) (ref 1.9–3.7)
Glucose, Bld: 97 mg/dL (ref 65–99)
Potassium: 4.6 mmol/L (ref 3.5–5.3)
Sodium: 140 mmol/L (ref 135–146)
Total Bilirubin: 0.5 mg/dL (ref 0.2–1.2)
Total Protein: 7 g/dL (ref 6.1–8.1)

## 2020-11-29 LAB — LIPID PANEL
Cholesterol: 172 mg/dL (ref <200)
HDL: 58 mg/dL (ref >=40)
LDL Cholesterol (Calc): 96 mg/dL (calc)
Non-HDL Cholesterol (Calc): 114 mg/dL (calc) (ref <130)
Total CHOL/HDL Ratio: 3 (calc) (ref <5.0)
Triglycerides: 86 mg/dL (ref <150)

## 2020-11-29 LAB — HEMOGLOBIN A1C
Hgb A1c MFr Bld: 5.5 % of total Hgb (ref <5.7)
Mean Plasma Glucose: 111 mg/dL
eAG (mmol/L): 6.2 mmol/L

## 2021-01-24 ENCOUNTER — Encounter: Payer: Self-pay | Admitting: Family Medicine

## 2021-03-28 ENCOUNTER — Ambulatory Visit (INDEPENDENT_AMBULATORY_CARE_PROVIDER_SITE_OTHER): Payer: Managed Care, Other (non HMO) | Admitting: Nurse Practitioner

## 2021-03-28 ENCOUNTER — Other Ambulatory Visit: Payer: Self-pay

## 2021-03-28 VITALS — BP 138/78 | HR 90 | Temp 98.3°F | Ht 75.0 in | Wt 187.0 lb

## 2021-03-28 DIAGNOSIS — M79602 Pain in left arm: Secondary | ICD-10-CM | POA: Diagnosis not present

## 2021-03-28 DIAGNOSIS — F411 Generalized anxiety disorder: Secondary | ICD-10-CM

## 2021-03-28 MED ORDER — DICLOFENAC SODIUM 50 MG PO TBEC: 50.0000 mg | DELAYED_RELEASE_TABLET | Freq: Two times a day (BID) | ORAL | 0 refills | Status: DC | PRN

## 2021-03-28 MED ORDER — ALPRAZOLAM 0.5 MG PO TABS: 0.5000 mg | ORAL_TABLET | Freq: Every day | ORAL | 0 refills | Status: DC | PRN

## 2021-03-28 NOTE — Progress Notes (Signed)
Subjective:    Patient ID: Mathew Larson, male    DOB: 1966/01/23, 55 y.o.   MRN: [id number]  HPI: Mathew Larson is a 55 y.o. male presenting for medication refill.  Chief Complaint  Patient presents with   genereralized anxiety    left arm pain     X 3 months   ANXIETY/STRESS Currently taking alprazolam 0.5 mg 1/2 tablet daily in the morning.  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  He is frustrated by having to come into the office so frequently for refills of this medication. Duration: Anxious mood: no  Excessive worrying: no Irritability: no  Sweating: no Nausea: no Palpitations:no Hyperventilation: no Panic attacks: no Agoraphobia: no  Obscessions/compulsions: no Depressed mood: no Depression screen [facility] 2/9 03/31/2021 03/28/2021 11/28/2020 04/11/2019 02/18/2018  Decreased Interest 0 0 0 0 0  Down, Depressed, Hopeless 0 0 0 0 0  PHQ - 2 Score 0 0 0 0 0  Altered sleeping 2 - 0 0 -  Tired, decreased energy 0 - 0 0 -  Change in appetite 0 - 0 0 -  Feeling bad or failure about yourself  0 - 0 0 -  Trouble concentrating 0 - 0 0 -  Moving slowly or fidgety/restless 0 - 0 0 -  Suicidal thoughts 0 - - 0 -  PHQ-9 Score 2 - 0 0 -  Difficult doing work/chores Not difficult at all - Not difficult at all Not difficult at all -    GAD 7 : Generalized Anxiety Score 03/31/2021 11/28/2020  Nervous, Anxious, on Edge 0 1  Control/stop worrying 0 1  Worry too much - different things 0 0  Trouble relaxing 0 0  Restless 0 0  Easily annoyed or irritable 0 0  Afraid - awful might happen 0 0  Total GAD 7 Score 0 2  Anxiety Difficulty Not difficult at all Not difficult at all    Anhedonia: no Weight changes: no Insomnia: yes   Hypersomnia: no Fatigue/loss of energy: no Feelings of worthlessness: no Feelings of guilt: no Impaired concentration/indecisiveness: no Suicidal ideations: no   Crying spells: no Recent Stressors/Life Changes: yes   Relationship problems: no   Family stress: no     Financial stress: no    Job stress: yes    Recent death/loss: no  ARM PAIN Duration: months Location: left arm bicep Mechanism of injury: pulled by dog on leash Onset: sudden Severity: severe Quality: shooting Frequency: intermittent Radiation: yes Aggravating factors: certain movements  Alleviating factors:  icy hot, Tylenol, ice, hot Status: better, slowly Treatments attempted: icy hot, Aleve, aspirin  Relief with NSAIDs?:  mild Swelling: no Redness: no  Warmth: no Trauma: no Chest pain: no  Shortness of breath: no  Fever: no Decreased sensation: no Paresthesias: no Weakness: no  Allergies  Allergen Reactions   Lisinopril     Fatigue, nausea    Outpatient Encounter Medications as of 03/28/2021  Medication Sig   diclofenac (VOLTAREN) 50 MG EC tablet Take 1 tablet (50 mg total) by mouth 2 (two) times daily as needed.   Garlic (GARLIQUE PO) Take by mouth.   magnesium 30 MG tablet Take 30 mg by mouth 2 (two) times daily.   Multiple Vitamins-Minerals (AIRBORNE PO) Take by mouth.   [DISCONTINUED] ALPRAZolam (XANAX) 0.5 MG tablet Take 1 tablet (0.5 mg total) by mouth daily as needed for anxiety.  ALPRAZolam (XANAX) 0.5 MG tablet Take 1 tablet (0.5 mg total) by mouth daily as needed for anxiety.   No facility-administered encounter medications on file as of 03/28/2021.    Patient Active Problem List   Diagnosis Date Noted   Essential hypertension 02/18/2018   Generalized anxiety disorder 09/10/2016    Past Medical History:  Diagnosis Date   Anxiety    Hypertension     Relevant past medical, surgical, family and social history reviewed and updated as indicated. Interim medical history since our last visit reviewed.  Review of Systems Per HPI unless specifically indicated above     Objective:    BP 138/78   Pulse 90   Temp 98.3 F (36.8 C)   Ht  6\' 3"  (1.905 m)   Wt 187 lb (84.8 kg)   SpO2 96%   BMI 23.37 kg/m   Wt Readings from Last 3 Encounters:  03/28/21 187 lb (84.8 kg)  11/28/20 189 lb (85.7 kg)  01/24/21 189 lb (85.7 kg)    Physical Exam Vitals and nursing note reviewed.  Constitutional:      General: He is not in acute distress.    Appearance: Normal appearance. He is not toxic-appearing.  Musculoskeletal:        General: Normal range of motion.     Right shoulder: Normal.     Left shoulder: Normal.     Left upper arm: Tenderness present. No swelling.       Arms:     Comments: Tenderness to the area marked with deep palpation.  No skin changes, masses palpated.  Skin:    General: Skin is warm and dry.     Coloration: Skin is not jaundiced or pale.     Findings: No erythema.  Neurological:     Mental Status: He is alert and oriented to person, place, and time.     Motor: No weakness.     Gait: Gait normal.  Psychiatric:        Mood and Affect: Mood normal.        Behavior: Behavior normal.        Thought Content: Thought content normal.        Judgment: Judgment normal.       Assessment & Plan:   Problem List Items Addressed This Visit       Other   Generalized anxiety disorder    Chronic.  Currently taking alprazolam 0.5 mg 1/2 tablet daily. Pt is aware of risks of psychoactive medication use to include increased sedation, respiratory suppression, falls, extrapyramidal movements,  dependence and cardiovascular events.  Pt would like to continue treatment as benefit determined to outweigh risk.  GAD-7 and PHQ-9 are not elevated today.  No SI/HI.  PDMP reviewed and appropriate-refill given.  Discussed again that refills will be given only with an office visit.  Follow-up in 3 to 6 months, whenever refills needed.      Relevant Medications   ALPRAZolam (XANAX) 0.5 MG tablet   Other Visit Diagnoses     Pain of left upper extremity    -  Primary   Relevant Medications   diclofenac (VOLTAREN) 50 MG EC  tablet        Pain of left upper extremity Suspect bicep strain.  No masses palpated on exam, no red flags today.  Start anti-inflammatory twice daily with meals.  Discussed side effects.  Return to clinic if this is not helping.  - diclofenac (VOLTAREN) 50 MG EC tablet; Take 1  tablet (50 mg total) by mouth 2 (two) times daily as needed.  Dispense: 60 tablet; Refill: 0 ]  Follow up plan: Return for 3-6 months.

## 2021-03-31 ENCOUNTER — Encounter: Payer: Self-pay | Admitting: Nurse Practitioner

## 2021-03-31 NOTE — Assessment & Plan Note (Signed)
Chronic.  Currently taking alprazolam 0.5 mg 1/2 tablet daily. Pt is aware of risks of psychoactive medication use to include increased sedation, respiratory suppression, falls, extrapyramidal movements,  dependence and cardiovascular events.  Pt would like to continue treatment as benefit determined to outweigh risk.  GAD-7 and PHQ-9 are not elevated today.  No SI/HI.  PDMP reviewed and appropriate-refill given.  Discussed again that refills will be given only with an office visit.  Follow-up in 3 to 6 months, whenever refills needed.

## 2021-05-29 ENCOUNTER — Ambulatory Visit (INDEPENDENT_AMBULATORY_CARE_PROVIDER_SITE_OTHER): Payer: Managed Care, Other (non HMO) | Admitting: Nurse Practitioner

## 2021-05-29 ENCOUNTER — Encounter: Payer: Self-pay | Admitting: Nurse Practitioner

## 2021-05-29 ENCOUNTER — Other Ambulatory Visit: Payer: Self-pay

## 2021-05-29 VITALS — BP 140/90 | HR 72 | Temp 97.3°F | Ht 72.0 in | Wt 191.2 lb

## 2021-05-29 DIAGNOSIS — Z23 Encounter for immunization: Secondary | ICD-10-CM

## 2021-05-29 DIAGNOSIS — F411 Generalized anxiety disorder: Secondary | ICD-10-CM | POA: Diagnosis not present

## 2021-05-29 MED ORDER — ALPRAZOLAM 0.5 MG PO TABS: 0.5000 mg | ORAL_TABLET | Freq: Every day | ORAL | 2 refills | Status: DC | PRN

## 2021-05-29 NOTE — Progress Notes (Signed)
Subjective:    Patient ID: Mathew Larson, male    DOB: May 18, 1966, 55 y.o.   MRN: [id number]  HPI: Mathew Larson is a 55 y.o. male presenting with wife for medication refill and flu shot.   Chief Complaint  Patient presents with   Follow-up    Med refill flu shot    ANXIETY/STRESS At last visit, we discussed that he is taking alprazolam 0.5 mg 1/2 tablet daily.  He is still taking the same amount.  He tells me the pharmacy has been giving him 30 tablets at a time.  The prescription was written for 90 tablets.  However, his insurance is only paying for 30 tablets monthly.  He should have 1 more refill at this pharmacy, even though the last bottle he picked up did not reflect this.  Reports the alprazolam is working well for him.  He is still not interested in an alternate daily medication.  No Active Allergies   Outpatient Encounter Medications as of 05/29/2021  Medication Sig   diclofenac (VOLTAREN) 50 MG EC tablet Take 1 tablet (50 mg total) by mouth 2 (two) times daily as needed.   Garlic (GARLIQUE PO) Take by mouth.   magnesium 30 MG tablet Take 30 mg by mouth 2 (two) times daily.   Multiple Vitamins-Minerals (AIRBORNE PO) Take by mouth.   [DISCONTINUED] ALPRAZolam (XANAX) 0.5 MG tablet Take 1 tablet (0.5 mg total) by mouth daily as needed for anxiety.   [START ON 06/24/2021] ALPRAZolam (XANAX) 0.5 MG tablet Take 1 tablet (0.5 mg total) by mouth daily as needed for anxiety.   No facility-administered encounter medications on file as of 05/29/2021.    Patient Active Problem List   Diagnosis Date Noted   Generalized anxiety disorder 09/10/2016   Hypertension 10/20/2012    Past Medical History:  Diagnosis Date   Anxiety    Hypertension     Relevant past medical, surgical, family and social history reviewed and updated as indicated. Interim medical history since our last visit reviewed.  Review of Systems Per HPI unless specifically indicated above      Objective:    BP 140/90   Pulse 72   Temp (!) 97.3 F (36.3 C)   Ht 6' (1.829 m)   Wt 191 lb 3.2 oz (86.7 kg)   SpO2 98%   BMI 25.93 kg/m   Wt Readings from Last 3 Encounters:  05/29/21 191 lb 3.2 oz (86.7 kg)  03/28/21 187 lb (84.8 kg)  11/28/20 189 lb (85.7 kg)    Physical Exam Vitals and nursing note reviewed.  Constitutional:      General: He is not in acute distress.    Appearance: Normal appearance. He is not toxic-appearing.  Skin:    General: Skin is warm and dry.     Coloration: Skin is not jaundiced or pale.     Findings: No erythema.  Neurological:     Mental Status: He is alert and oriented to person, place, and time.     Motor: No weakness.     Gait: Gait normal.  Psychiatric:        Mood and Affect: Mood normal.        Behavior: Behavior normal.        Thought Content: Thought content normal.        Judgment: Judgment normal.      Assessment & Plan:   Problem List Items Addressed This Visit       Other   Generalized  anxiety disorder - Primary    Chronic.  OV is 1 month early today because of misunderstanding with insurance.  Stable with alprazolam 0.5 mg 1/2 tablet daily.  Pt is aware of risks of psychoactive medication use to include increased sedation, respiratory suppression, falls, extrapyramidal movements,  dependence and cardiovascular events.  Pt would like to continue treatment as benefit determined to outweigh risk.  PDMP reviewed - since insurance will only pay for 30 tablets per month, will send in 30 tablets with 3 refills.  Future dated today.  Follow up in 3 months.       Relevant Medications   ALPRAZolam (XANAX) 0.5 MG tablet (Start on 06/24/2021)   Other Visit Diagnoses     Need for immunization against influenza       Relevant Orders   Flu Vaccine QUAD 2mo+IM (Fluarix, Fluzone & Alfiuria Quad PF) (Completed)         Follow up plan: Return in about 3 months (around 08/29/2021) for mood f/u.

## 2021-05-29 NOTE — Assessment & Plan Note (Addendum)
Chronic.  OV is 1 month early today because of misunderstanding with insurance.  Stable with alprazolam 0.5 mg 1/2 tablet daily.  Pt is aware of risks of psychoactive medication use to include increased sedation, respiratory suppression, falls, extrapyramidal movements,  dependence and cardiovascular events.  Pt would like to continue treatment as benefit determined to outweigh risk.  PDMP reviewed - since insurance will only pay for 30 tablets per month, will send in 30 tablets with 3 refills.  Future dated today.  Follow up in 3 months.

## 2022-01-20 ENCOUNTER — Encounter: Payer: Self-pay | Admitting: Nurse Practitioner

## 2022-01-20 ENCOUNTER — Ambulatory Visit (INDEPENDENT_AMBULATORY_CARE_PROVIDER_SITE_OTHER): Payer: Managed Care, Other (non HMO) | Admitting: Nurse Practitioner

## 2022-01-20 DIAGNOSIS — F411 Generalized anxiety disorder: Secondary | ICD-10-CM

## 2022-01-20 MED ORDER — ALPRAZOLAM 0.5 MG PO TABS: 0.5000 mg | ORAL_TABLET | Freq: Every day | ORAL | 2 refills | Status: DC | PRN

## 2022-01-20 NOTE — Progress Notes (Signed)
Attempted to contact patient. Pt phone message states that call can not be completed at this time. [place] location is [address] [place]-this is the closest location but states "Coming Soon" on website.

## 2022-01-20 NOTE — Progress Notes (Signed)
   Subjective:    Patient ID: Mathew Larson, male    DOB: 1965/11/22, 56 y.o.   MRN: [id number]  HPI  Patient here to establish care. Needs refill on Xanax.   Patient has history of chronic anxiety.  Patient has been taking Xanax since 2006.  Patient has trialed Lexapro however states that it made him feel sick and unpleasant and gave him GI issues.  Patient weaned himself off the Lexapro.  Patient takes 0.5 mg of Xanax a day.  Patient states when he does not have his Xanax he feels a pressure in his abdomen which he now knows his anxiety.  Patient states that he is open to cognitive behavioral therapy.  Patient denies any thoughts of wanting to hurt self or anyone else.  Review of Systems  All other systems reviewed and are negative.      Objective:   Physical Exam Vitals reviewed.  Constitutional:      General: He is not in acute distress.    Appearance: Normal appearance. He is normal weight. He is not ill-appearing, toxic-appearing or diaphoretic.  HENT:     Head: Normocephalic and atraumatic.  Cardiovascular:     Rate and Rhythm: Normal rate and regular rhythm.     Pulses: Normal pulses.     Heart sounds: Normal heart sounds. No murmur heard. Pulmonary:     Effort: Pulmonary effort is normal. No respiratory distress.     Breath sounds: Normal breath sounds. No wheezing.  Musculoskeletal:     Cervical back: Normal range of motion and neck supple. No rigidity or tenderness.     Comments: Grossly intact  Lymphadenopathy:     Cervical: No cervical adenopathy.  Skin:    General: Skin is warm.     Capillary Refill: Capillary refill takes less than 2 seconds.  Neurological:     Mental Status: He is alert.     Comments: Grossly intact  Psychiatric:        Mood and Affect: Mood normal.        Behavior: Behavior normal.           Assessment & Plan:   1. Generalized anxiety disorder -Discussed Xanax and side effects in detail -We discussed that Xanax  should not be a long-term medication. -However due to the fact that the patient is taking Xanax for multiple years we will continue with Xanax prescription today and seek out cognitive behavioral therapy. -Discussed cognitive behavioral therapy in detail and patient is open to the thought of counseling -We will research counselors in the area who specializes in cognitive behavioral therapy - ALPRAZolam (XANAX) 0.5 MG tablet; Take 1 tablet (0.5 mg total) by mouth daily as needed for anxiety.  Dispense: 30 tablet; Refill: 2 -Return to clinic in 3 months    Note:  This document was prepared using Dragon voice recognition software and may include unintentional dictation errors. Note - This record has been created using [employer].  Chart creation errors have been sought, but may not always  have been located. Such creation errors do not reflect on  the standard of medical care.

## 2022-01-24 ENCOUNTER — Telehealth: Payer: Self-pay

## 2022-01-24 NOTE — Telephone Encounter (Addendum)
-----   Message from Mathew Lever, NP sent at 01/20/2022  4:21 PM EDT ----- Nurses please call the patient with the following message  I had a chance to research therapist who did cognitive behavioral therapy.  I was able to find [facility].  They told me that they took your insurance and they also do televisits.  The contact number is [phone].  They said they could get you in as early as next week if you are interested.  I am hoping this works for you.  Teresa Coombs    --- Left message for patient to return the call for additional details and recommendations.

## 2022-01-24 NOTE — Progress Notes (Signed)
Left message for patient to return the call for additional details and recommendations.   

## 2022-01-27 NOTE — Progress Notes (Signed)
Pt returned call and verbalized understanding  

## 2022-04-22 ENCOUNTER — Ambulatory Visit: Payer: Managed Care, Other (non HMO) | Admitting: Nurse Practitioner

## 2022-04-23 ENCOUNTER — Ambulatory Visit (INDEPENDENT_AMBULATORY_CARE_PROVIDER_SITE_OTHER): Payer: Managed Care, Other (non HMO) | Admitting: *Deleted

## 2022-04-23 DIAGNOSIS — Z23 Encounter for immunization: Secondary | ICD-10-CM | POA: Diagnosis not present

## 2022-08-05 ENCOUNTER — Ambulatory Visit (INDEPENDENT_AMBULATORY_CARE_PROVIDER_SITE_OTHER): Payer: Managed Care, Other (non HMO) | Admitting: Family Medicine

## 2022-08-05 DIAGNOSIS — Z130 Encounter for screening for diseases of the blood and blood-forming organs and certain disorders involving the immune mechanism: Secondary | ICD-10-CM | POA: Diagnosis not present

## 2022-08-05 DIAGNOSIS — I10 Essential (primary) hypertension: Secondary | ICD-10-CM | POA: Diagnosis not present

## 2022-08-05 DIAGNOSIS — Z13220 Encounter for screening for lipoid disorders: Secondary | ICD-10-CM

## 2022-08-05 DIAGNOSIS — F411 Generalized anxiety disorder: Secondary | ICD-10-CM

## 2022-08-05 DIAGNOSIS — Z125 Encounter for screening for malignant neoplasm of prostate: Secondary | ICD-10-CM

## 2022-08-05 MED ORDER — LOSARTAN POTASSIUM 25 MG PO TABS: 25.0000 mg | ORAL_TABLET | Freq: Every day | ORAL | 1 refills | Status: DC

## 2022-08-05 MED ORDER — ALPRAZOLAM 0.5 MG PO TABS: 0.5000 mg | ORAL_TABLET | Freq: Every day | ORAL | 2 refills | Status: DC | PRN

## 2022-08-05 NOTE — Progress Notes (Signed)
Subjective:  Patient ID: Mathew Larson, male    DOB: 28-Jan-1966  Age: 57 y.o. MRN: [id number]  CC: Chief Complaint  Patient presents with   Anxiety    Follow up Needs refills of Alprazolam Discuss need for blood pressure med- BP been running high    HPI:  57 year old male with hypertension and generalized anxiety presents for follow-up.  Patient states that his blood pressures have been running on the high side.  BP 139/83 here today.  Patient has previously taken amlodipine and states that he did not tolerate higher doses.  Low-dose did not help his hypertension.  He would like to discuss treatment options today.  Patient states that he has had a rough year this past year due to deaths in the family as well as his wife getting diagnosed with breast cancer.  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  His wife has a good prognosis.  Patient states that alprazolam works well for him and he uses intermittently.  Needs refill.  Patient Active Problem List   Diagnosis Date Noted   Generalized anxiety disorder 09/10/2016   Hypertension 10/20/2012    Social Hx   Social History   Socioeconomic History   Marital status: Married    Spouse name: Not on file   Number of children: Not on file   Years of education: Not on file   Highest education level: Not on file  Occupational History   Not on file  Tobacco Use   Smoking status: Never   Smokeless tobacco: Never  Substance and Sexual Activity   Alcohol use: No   Drug use: No   Sexual activity: Yes  Other Topics Concern   Not on file  Social History Narrative   Not on file   Social Determinants of Health   Financial Resource Strain: Not on file  Food Insecurity: Not on file  Transportation Needs: Not on file  Physical Activity: Not on file  Stress: Not on file  Social Connections: Not on file    Review of Systems Per HPI  Objective:  BP 139/83   Ht 6' (1.829 m)    Wt 188 lb 4.8 oz (85.4 kg)   BMI 25.54 kg/m      08/05/2022    8:24 AM 01/20/2022    3:16 PM 01/20/2022    1:29 PM  BP/Weight  Systolic BP 100 712 197  Diastolic BP 83 80 88  Wt. (Lbs) 188.3  189.8  BMI 25.54 kg/m2  25.74 kg/m2    Physical Exam Vitals and nursing note reviewed.  Constitutional:      General: He is not in acute distress.    Appearance: Normal appearance.  HENT:     Head: Normocephalic and atraumatic.  Cardiovascular:     Rate and Rhythm: Normal rate and regular rhythm.  Pulmonary:     Effort: Pulmonary effort is normal.     Breath sounds: Normal breath sounds. No wheezing, rhonchi or rales.  Neurological:     Mental Status: He is alert.  Psychiatric:        Mood and Affect: Mood normal.        Behavior: Behavior normal.     Lab Results  Component Value Date   WBC 4.8 11/28/2020   HGB 15.1 11/28/2020   HCT 45.4 11/28/2020   PLT 239 11/28/2020   GLUCOSE 97 11/28/2020   CHOL 172 11/28/2020   TRIG 86 11/28/2020  HDL 58 11/28/2020   LDLCALC 96 11/28/2020   ALT 16 11/28/2020   AST 15 11/28/2020   NA 140 11/28/2020   K 4.6 11/28/2020   CL 103 11/28/2020   CREATININE 0.91 11/28/2020   BUN 14 11/28/2020   CO2 31 11/28/2020   PSA 1.4 04/11/2019   HGBA1C 5.5 11/28/2020     Assessment & Plan:   Problem List Items Addressed This Visit       Cardiovascular and Mediastinum   Hypertension    Starting losartan.  Low-dose.  Labs in 7 to 10 days.      Relevant Medications   losartan (COZAAR) 25 MG tablet   Other Relevant Orders   CMP14+EGFR     Other   Generalized anxiety disorder    Stable on alprazolam.  Continue.  Refilled today.      Relevant Medications   ALPRAZolam (XANAX) 0.5 MG tablet   Other Visit Diagnoses     Screening for deficiency anemia       Relevant Orders   CBC   Screening, lipid       Relevant Orders   Lipid panel   Prostate cancer screening       Relevant Orders   PSA       Meds ordered this encounter   Medications   losartan (COZAAR) 25 MG tablet    Sig: Take 1 tablet (25 mg total) by mouth daily.    Dispense:  90 tablet    Refill:  1   ALPRAZolam (XANAX) 0.5 MG tablet    Sig: Take 1 tablet (0.5 mg total) by mouth daily as needed for anxiety.    Dispense:  30 tablet    Refill:  2    Follow-up:  Return in about 3 months (around 11/04/2022).  [place]

## 2022-08-05 NOTE — Assessment & Plan Note (Signed)
Starting losartan.  Low-dose.  Labs in 7 to 10 days.

## 2022-08-05 NOTE — Patient Instructions (Signed)
Labs in 7-10 days.  Medications as prescribed.  Follow up in 3 months.

## 2022-08-05 NOTE — Assessment & Plan Note (Signed)
Stable on alprazolam.  Continue.  Refilled today.

## 2022-08-16 LAB — CMP14+EGFR
ALT: 18 IU/L (ref 0–44)
AST: 15 IU/L (ref 0–40)
Albumin/Globulin Ratio: 2 (ref 1.2–2.2)
Albumin: 4.6 g/dL (ref 3.8–4.9)
Alkaline Phosphatase: 62 IU/L (ref 44–121)
BUN/Creatinine Ratio: 16 (ref 9–20)
BUN: 16 mg/dL (ref 6–24)
Bilirubin Total: 0.7 mg/dL (ref 0.0–1.2)
CO2: 23 mmol/L (ref 20–29)
Calcium: 9.4 mg/dL (ref 8.7–10.2)
Chloride: 103 mmol/L (ref 96–106)
Creatinine, Ser: 1.01 mg/dL (ref 0.76–1.27)
Globulin, Total: 2.3 g/dL (ref 1.5–4.5)
Glucose: 101 mg/dL — ABNORMAL HIGH (ref 70–99)
Potassium: 4.2 mmol/L (ref 3.5–5.2)
Sodium: 141 mmol/L (ref 134–144)
Total Protein: 6.9 g/dL (ref 6.0–8.5)
eGFR: 88 mL/min/{1.73_m2} (ref >59)

## 2022-08-16 LAB — CBC
Hematocrit: 45.9 % (ref 37.5–51.0)
Hemoglobin: 15.1 g/dL (ref 13.0–17.7)
MCH: 29.7 pg (ref 26.6–33.0)
MCHC: 32.9 g/dL (ref 31.5–35.7)
MCV: 90 fL (ref 79–97)
Platelets: 242 10*3/uL (ref 150–450)
RBC: 5.09 x10E6/uL (ref 4.14–5.80)
RDW: 12.2 % (ref 11.6–15.4)
WBC: 5.1 10*3/uL (ref 3.4–10.8)

## 2022-08-16 LAB — PSA: Prostate Specific Ag, Serum: 3.9 ng/mL (ref 0.0–4.0)

## 2022-08-16 LAB — LIPID PANEL
Chol/HDL Ratio: 3.4 ratio (ref 0.0–5.0)
Cholesterol, Total: 193 mg/dL (ref 100–199)
HDL: 56 mg/dL (ref >39)
LDL Chol Calc (NIH): 120 mg/dL — ABNORMAL HIGH (ref 0–99)
Triglycerides: 92 mg/dL (ref 0–149)
VLDL Cholesterol Cal: 17 mg/dL (ref 5–40)

## 2022-08-18 ENCOUNTER — Other Ambulatory Visit: Payer: Self-pay | Admitting: Family Medicine

## 2022-08-18 MED ORDER — ROSUVASTATIN CALCIUM 10 MG PO TABS: 10.0000 mg | ORAL_TABLET | Freq: Every day | ORAL | 3 refills | Status: DC

## 2022-08-18 NOTE — Addendum Note (Signed)
Addended by: Dairl Ponder on: 08/18/2022 11:22 AM   Modules accepted: Orders

## 2022-09-08 ENCOUNTER — Other Ambulatory Visit: Payer: Self-pay | Admitting: Family Medicine

## 2022-09-27 ENCOUNTER — Other Ambulatory Visit: Payer: Self-pay

## 2022-09-27 ENCOUNTER — Emergency Department (HOSPITAL_COMMUNITY)
Admission: EM | Admit: 2022-09-27 | Discharge: 2022-09-27 | Disposition: A | Discharge status: Home / Self Care | Payer: Managed Care, Other (non HMO) | Attending: Emergency Medicine | Admitting: Emergency Medicine

## 2022-09-27 ENCOUNTER — Encounter (HOSPITAL_COMMUNITY): Payer: Self-pay

## 2022-09-27 DIAGNOSIS — Z79899 Other long term (current) drug therapy: Secondary | ICD-10-CM | POA: Insufficient documentation

## 2022-09-27 DIAGNOSIS — T881XXA Other complications following immunization, not elsewhere classified, initial encounter: Secondary | ICD-10-CM | POA: Diagnosis not present

## 2022-09-27 DIAGNOSIS — I10 Essential (primary) hypertension: Secondary | ICD-10-CM | POA: Diagnosis not present

## 2022-09-27 DIAGNOSIS — T50Z95A Adverse effect of other vaccines and biological substances, initial encounter: Secondary | ICD-10-CM

## 2022-09-27 DIAGNOSIS — R42 Dizziness and giddiness: Secondary | ICD-10-CM | POA: Diagnosis present

## 2022-09-27 NOTE — Discharge Instructions (Signed)
Recommend treatment of symptoms with extra strength Tylenol 2 tablets every 8 hours and 800 mg of Motrin every 8 hours.  Most likely these are side effects of the immunization or just coincidentally you are coming down with a viral illness.  Return for any tongue swelling lip swelling any trouble breathing or tightness in the throat.

## 2022-09-27 NOTE — ED Triage Notes (Signed)
Pt received tetanus shot yesterday at [facility] after laceration to R middle finger. Began having severe body aches, syncopal episode, dizziness, fatigue, low grade fever, general unwell feeling since 7 am.   Googled symptoms and states it feels like an allergic reaction to the tetanus.  Pt is ambulatory, A & O x 4, NAD.  This is a new reaction, no hx of previous reaction to any vaccine.

## 2022-09-27 NOTE — ED Provider Notes (Signed)
[facility] Provider Note   CSN: [id number] Arrival date & time: 09/27/22  1453     History  Chief Complaint  Patient presents with   Allergic Reaction    Mathew Larson is a 57 y.o. male.  Patient received tetanus shot update yesterday at urgent care when he went in for a laceration of the right middle finger.  This morning he began having bodyaches fatigue some dizziness generally feeling unwell.  Near syncopal episode low-grade fever.  Patient concerned about it being an allergic reaction.  Patient denies any rash or any tongue swelling lip swelling difficulty breathing or tightness in the throat.  Past medical history significant for hypertension patient is never used tobacco products.  Patient has not had any difficulty with a tetanus vaccine in the past.  Patient received a tetanus shot yesterday at 9 in the morning.       Home Medications Prior to Admission medications   Medication Sig Start Date End Date Taking? Authorizing Provider  ALPRAZolam Duanne Moron) 0.5 MG tablet Take 1 tablet (0.5 mg total) by mouth daily as needed for anxiety. 08/05/22   Coral Spikes, DO  Garlic (GARLIQUE PO) Take by mouth.    [provider]  losartan (COZAAR) 25 MG tablet TAKE 1 TABLET(25 MG) BY MOUTH DAILY 09/11/22   Thersa Salt G, DO  magnesium 30 MG tablet Take 30 mg by mouth daily.    [provider]  Multiple Vitamins-Minerals (AIRBORNE PO) Take by mouth.    [provider]  rosuvastatin (CRESTOR) 10 MG tablet Take 1 tablet (10 mg total) by mouth daily. 08/18/22   Coral Spikes, DO      Allergies    Patient has no active allergies.    Review of Systems   Review of Systems  Constitutional:  Positive for fatigue and fever. Negative for chills.  HENT:  Positive for congestion and sinus pressure. Negative for ear pain, facial swelling, sore throat and trouble swallowing.   Eyes:  Negative for pain and visual  disturbance.  Respiratory:  Negative for cough, shortness of breath and wheezing.   Cardiovascular:  Negative for chest pain and palpitations.  Gastrointestinal:  Negative for abdominal pain and vomiting.  Genitourinary:  Negative for dysuria and hematuria.  Musculoskeletal:  Positive for myalgias. Negative for arthralgias and back pain.  Skin:  Negative for color change and rash.  Neurological:  Positive for dizziness and light-headedness. Negative for seizures and syncope.  All other systems reviewed and are negative.   Physical Exam Updated Vital Signs BP (!) 112/99 (BP Location: Right Arm)   Pulse 81   Temp 97.6 F (36.4 C) (Temporal)   Resp 18   Ht 1.905 m (6\' 3" )   Wt 86.2 kg   SpO2 94%   BMI 23.75 kg/m  Physical Exam Vitals and nursing note reviewed.  Constitutional:      General: He is not in acute distress.    Appearance: Normal appearance. He is well-developed.  HENT:     Head: Normocephalic and atraumatic.  Eyes:     Conjunctiva/sclera: Conjunctivae normal.  Cardiovascular:     Rate and Rhythm: Normal rate and regular rhythm.     Heart sounds: No murmur heard. Pulmonary:     Effort: Pulmonary effort is normal. No respiratory distress.     Breath sounds: Normal breath sounds.  Abdominal:     Palpations: Abdomen is soft.     Tenderness: There is no  abdominal tenderness.  Musculoskeletal:        General: No swelling.     Cervical back: Normal range of motion and neck supple.  Skin:    General: Skin is warm and dry.     Capillary Refill: Capillary refill takes less than 2 seconds.  Neurological:     General: No focal deficit present.     Mental Status: He is alert and oriented to person, place, and time.     Cranial Nerves: No cranial nerve deficit.     Sensory: No sensory deficit.     Motor: No weakness.  Psychiatric:        Mood and Affect: Mood normal.     ED Results / Procedures / Treatments   Labs (all labs ordered are listed, but only abnormal  results are displayed) Labs Reviewed - No data to display  EKG None  Radiology No results found.  Procedures Procedures    Medications Ordered in ED Medications - No data to display  ED Course/ Medical Decision Making/ A&P                             Medical Decision Making  Patient nontoxic no acute distress.  No classic allergic reaction symptoms.  Plus the onset of symptoms were almost 24 hours after receiving the tetanus vaccine.  Suspect this may be side effects or the patient could be coincidentally coming down with a flulike illness.  Will recommend symptomatic treatment with extra strength Tylenol and Motrin for right now.  Patient will return for any new or worse symptoms.  Patient will return for sure for tongue swelling lip swelling breaking out in a rash trouble breathing throat tightness.   Final Clinical Impression(s) / ED Diagnoses Final diagnoses:  Side effects of vaccination, initial encounter    Rx / DC Orders ED Discharge Orders     None         Fredia Sorrow, MD 09/27/22 1545

## 2022-09-30 ENCOUNTER — Telehealth: Payer: Self-pay

## 2022-09-30 NOTE — Transitions of Care (Post Inpatient/ED Visit) (Unsigned)
   09/30/2022  Name: Isias Weideman MRN: [id number] DOB: 01-28-1966  Today's TOC FU Call Status:    Attempted to reach the patient regarding the most recent Inpatient/ED visit.  Follow Up Plan: Additional outreach attempts will be made to reach the patient to complete the Transitions of Care (Post Inpatient/ED visit) call.   Signature Juanda Crumble, [place] Direct Dial [phone]

## 2022-10-01 NOTE — Transitions of Care (Post Inpatient/ED Visit) (Signed)
   10/01/2022  Name: Mathew Larson MRN: [id number] DOB: 1965/09/09  Today's TOC FU Call Status: Today's TOC FU Call Status:: Successful TOC FU Call Competed TOC FU Call Complete Date: 10/01/22  Transition Care Management Follow-up Telephone Call Date of Discharge: 09/27/22 Dis[facility] Type of Discharge: Emergency Department Reason for ED Visit: Other: (adverse deffect of vaccine) How have you been since you were released from the [facility]?: Better Any questions or concerns?: No  Items Reviewed: Did you receive and understand the discharge instructions provided?: Yes Medications obtained and verified?: Yes (Medications Reviewed) Any new allergies since your discharge?: No Dietary orders reviewed?: Yes Do you have support at home?: Yes People in Home: spouse  Home Care and Equipment/Supplies: [facility] Ordered?: NA Any new equipment or medical supplies ordered?: NA  Functional Questionnaire: Do you need assistance with bathing/showering or dressing?: No Do you need assistance with meal preparation?: No Do you need assistance with eating?: No Do you have difficulty maintaining continence: No Do you need assistance with getting out of bed/getting out of a chair/moving?: No Do you have difficulty managing or taking your medications?: No  Follow up appointments reviewed: PCP Follow-up appointment confirmed?: [facility] Follow-up appointment confirmed?: NA Do you need transportation to your follow-up appointment?: No Do you understand care options if your condition(s) worsen?: Yes-patient verbalized understanding    [address], [place] Direct Dial [phone]

## 2022-11-15 ENCOUNTER — Other Ambulatory Visit: Payer: Self-pay | Admitting: Nurse Practitioner

## 2022-11-15 DIAGNOSIS — Z1211 Encounter for screening for malignant neoplasm of colon: Secondary | ICD-10-CM

## 2022-12-05 LAB — COLOGUARD: COLOGUARD: NEGATIVE

## 2023-01-28 ENCOUNTER — Ambulatory Visit: Payer: Managed Care, Other (non HMO) | Admitting: Family Medicine

## 2023-01-28 VITALS — BP 151/92 | HR 66 | Temp 97.7°F | Ht 75.0 in | Wt 190.6 lb

## 2023-01-28 DIAGNOSIS — I10 Essential (primary) hypertension: Secondary | ICD-10-CM

## 2023-01-28 DIAGNOSIS — F411 Generalized anxiety disorder: Secondary | ICD-10-CM

## 2023-01-28 DIAGNOSIS — E785 Hyperlipidemia, unspecified: Secondary | ICD-10-CM | POA: Diagnosis not present

## 2023-01-28 MED ORDER — ALPRAZOLAM 0.5 MG PO TABS: 0.5000 mg | ORAL_TABLET | Freq: Every day | ORAL | 5 refills | Status: DC | PRN

## 2023-01-28 NOTE — Progress Notes (Signed)
Subjective:  Patient ID: Mathew Larson, male    DOB: 11/06/1965  Age: 57 y.o. MRN: [id number]  CC:  Follow up   HPI:  57 year old male with anxiety, hypertension, hyperlipidemia presents for follow-up.  Patient notes that he is under a lot of stress related to his work.  Needs refill on alprazolam.   Patient reports that his blood pressures have been well-controlled despite the fact that his BP is elevated here today.  He is compliant with losartan.  Patient Active Problem List   Diagnosis Date Noted   Hyperlipidemia 01/28/2023   Generalized anxiety disorder 09/10/2016   Hypertension 10/20/2012    Social Hx   Social History   Socioeconomic History   Marital status: Married    Spouse name: Not on file   Number of children: Not on file   Years of education: Not on file   Highest education level: Not on file  Occupational History   Not on file  Tobacco Use   Smoking status: Never   Smokeless tobacco: Never  Substance and Sexual Activity   Alcohol use: Never   Drug use: No   Sexual activity: Yes  Other Topics Concern   Not on file  Social History Narrative   Not on file   Social Determinants of Health   Financial Resource Strain: Not on file  Food Insecurity: Not on file  Transportation Needs: Not on file  Physical Activity: Not on file  Stress: Not on file  Social Connections: Not on file    Review of Systems  Respiratory: Negative.    Cardiovascular: Negative.     Objective:  BP (!) 151/92   Pulse 66   Temp 97.7 F (36.5 C)   Ht 6\' 3"  (1.905 m)   Wt 190 lb 9.6 oz (86.5 kg)   SpO2 98%   BMI 23.82 kg/m      01/28/2023   11:33 AM 09/27/2022    3:00 PM 08/05/2022    8:24 AM  BP/Weight  Systolic BP 151 112 139  Diastolic BP 92 99 83  Wt. (Lbs) 190.6 190 188.3  BMI 23.82 kg/m2 23.75 kg/m2 25.54 kg/m2    Physical Exam Vitals and nursing note reviewed.  Constitutional:      General: He is not in acute distress.    Appearance: Normal  appearance.  HENT:     Head: Normocephalic and atraumatic.  Cardiovascular:     Rate and Rhythm: Normal rate and regular rhythm.  Pulmonary:     Effort: Pulmonary effort is normal.     Breath sounds: Normal breath sounds.  Neurological:     Mental Status: He is alert.  Psychiatric:        Mood and Affect: Mood normal.        Behavior: Behavior normal.     Lab Results  Component Value Date   WBC 5.1 08/15/2022   HGB 15.1 08/15/2022   HCT 45.9 08/15/2022   PLT 242 08/15/2022   GLUCOSE 101 (H) 08/15/2022   CHOL 193 08/15/2022   TRIG 92 08/15/2022   HDL 56 08/15/2022   LDLCALC 120 (H) 08/15/2022   ALT 18 08/15/2022   AST 15 08/15/2022   NA 141 08/15/2022   K 4.2 08/15/2022   CL 103 08/15/2022   CREATININE 1.01 08/15/2022   BUN 16 08/15/2022   CO2 23 08/15/2022   PSA 1.4 04/11/2019   HGBA1C 5.5 11/28/2020     Assessment & Plan:   Problem List Items Addressed This  Visit       Cardiovascular and Mediastinum   Hypertension - Primary    Has been stable.  Continue losartan.        Other   Generalized anxiety disorder    Relatively well-controlled.  A lot of anxiety is due to his work.  Alprazolam refilled.      Relevant Medications   ALPRAZolam (XANAX) 0.5 MG tablet   Hyperlipidemia    Meds ordered this encounter  Medications   ALPRAZolam (XANAX) 0.5 MG tablet    Sig: Take 1 tablet (0.5 mg total) by mouth daily as needed for anxiety.    Dispense:  30 tablet    Refill:  5    Follow-up:  6 months  Sanjuanita Condrey Adriana Simas DO [facility]

## 2023-01-28 NOTE — Patient Instructions (Signed)
Continue your medications.  Follow up 6 months.  Take care  Dr. Adriana Simas

## 2023-01-28 NOTE — Assessment & Plan Note (Signed)
Has been stable.  Continue losartan.

## 2023-01-28 NOTE — Assessment & Plan Note (Signed)
Relatively well-controlled.  A lot of anxiety is due to his work.  Alprazolam refilled.

## 2023-06-10 ENCOUNTER — Ambulatory Visit: Payer: Managed Care, Other (non HMO)

## 2023-06-10 DIAGNOSIS — Z23 Encounter for immunization: Secondary | ICD-10-CM

## 2023-08-24 ENCOUNTER — Other Ambulatory Visit: Payer: Self-pay | Admitting: Family Medicine

## 2023-09-27 ENCOUNTER — Other Ambulatory Visit: Payer: Self-pay | Admitting: Family Medicine

## 2023-10-06 ENCOUNTER — Ambulatory Visit (INDEPENDENT_AMBULATORY_CARE_PROVIDER_SITE_OTHER): Admitting: Family Medicine

## 2023-10-06 VITALS — BP 110/73 | HR 76 | Temp 98.6°F | Ht 75.0 in | Wt 193.4 lb

## 2023-10-06 DIAGNOSIS — F411 Generalized anxiety disorder: Secondary | ICD-10-CM | POA: Diagnosis not present

## 2023-10-06 DIAGNOSIS — E785 Hyperlipidemia, unspecified: Secondary | ICD-10-CM

## 2023-10-06 DIAGNOSIS — I10 Essential (primary) hypertension: Secondary | ICD-10-CM

## 2023-10-06 MED ORDER — ALPRAZOLAM 0.5 MG PO TABS: 0.2500 mg | ORAL_TABLET | Freq: Two times a day (BID) | ORAL | 5 refills | Status: DC | PRN

## 2023-10-06 MED ORDER — LOSARTAN POTASSIUM 25 MG PO TABS: 25.0000 mg | ORAL_TABLET | Freq: Every day | ORAL | 3 refills | Status: DC

## 2023-10-06 MED ORDER — ROSUVASTATIN CALCIUM 10 MG PO TABS: 10.0000 mg | ORAL_TABLET | Freq: Every day | ORAL | 3 refills | Status: DC

## 2023-10-06 NOTE — Assessment & Plan Note (Signed)
Well-controlled.  Continue losartan.  Refilled today.

## 2023-10-06 NOTE — Patient Instructions (Signed)
 Medications refilled.  Follow up in 6 months.

## 2023-10-06 NOTE — Assessment & Plan Note (Signed)
 Tolerating Crestor.  Continue.  Refilled.

## 2023-10-06 NOTE — Progress Notes (Signed)
 Subjective:  Patient ID: Mathew Larson, male    DOB: April 09, 1966  Age: 58 y.o. MRN: [id number]  CC:  Follow up   HPI:  58 year old male with Anxiety, HTN, HLD presents for follow up.  BP is well controlled on Losartan.  Anxiety stable on PRN Xanax. Reports chronic issues with sleep. Sleeps about 5 hours a night. Trouble initiating and maintaining sleep.   Tolerating Crestor. No reported side effects.   Patient Active Problem List   Diagnosis Date Noted   Hyperlipidemia 01/28/2023   Generalized anxiety disorder 09/10/2016   Hypertension 10/20/2012    Social Hx   Social History   Socioeconomic History   Marital status: Married    Spouse name: Not on file   Number of children: Not on file   Years of education: Not on file   Highest education level: Bachelor's degree (e.g., BA, AB, BS)  Occupational History   Not on file  Tobacco Use   Smoking status: Never   Smokeless tobacco: Never  Substance and Sexual Activity   Alcohol use: Never   Drug use: No   Sexual activity: Yes  Other Topics Concern   Not on file  Social History Narrative   Not on file   Social Drivers of Health   Financial Resource Strain: Low Risk  (10/05/2023)   Overall Financial Resource Strain (CARDIA)    Difficulty of Paying Living Expenses: Not hard at all  Food Insecurity: No Food Insecurity (10/05/2023)   Hunger Vital Sign    Worried About Running Out of Food in the Last Year: Never true    Ran Out of Food in the Last Year: Never true  Transportation Needs: No Transportation Needs (10/05/2023)   PRAPARE - Administrator, Civil Service (Medical): No    Lack of Transportation (Non-Medical): No  Physical Activity: Sufficiently Active (10/05/2023)   Exercise Vital Sign    Days of Exercise per Week: 5 days    Minutes of Exercise per Session: 30 min  Stress: Stress Concern Present (10/05/2023)   [employer] of Occupational Health - Occupational Stress Questionnaire     Feeling of Stress : Rather much  Social Connections: Unknown (10/05/2023)   Social Connection and Isolation Panel [NHANES]    Frequency of Communication with Friends and Family: More than three times a week    Frequency of Social Gatherings with Friends and Family: Once a week    Attends Religious Services: Patient declined    Database administrator or Organizations: Patient declined    Attends Engineer, structural: Not on file    Marital Status: Married    Review of Systems  Constitutional: Negative.   Respiratory: Negative.    Cardiovascular: Negative.    Objective:  BP 110/73   Pulse 76   Temp 98.6 F (37 C)   Ht 6\' 3"  (1.905 m)   Wt 193 lb 6.4 oz (87.7 kg)   BMI 24.17 kg/m      10/06/2023    1:46 PM 01/28/2023   11:33 AM 09/27/2022    3:00 PM  BP/Weight  Systolic BP 110 151 112  Diastolic BP 73 92 99  Wt. (Lbs) 193.4 190.6 190  BMI 24.17 kg/m2 23.82 kg/m2 23.75 kg/m2    Physical Exam Vitals and nursing note reviewed.  Constitutional:      General: He is not in acute distress.    Appearance: Normal appearance.  HENT:     Head: Normocephalic and atraumatic.  Eyes:     General:        Right eye: No discharge.        Left eye: No discharge.     Conjunctiva/sclera: Conjunctivae normal.  Cardiovascular:     Rate and Rhythm: Normal rate and regular rhythm.  Pulmonary:     Effort: Pulmonary effort is normal.     Breath sounds: Normal breath sounds. No wheezing, rhonchi or rales.  Neurological:     Mental Status: He is alert.  Psychiatric:        Mood and Affect: Mood normal.        Behavior: Behavior normal.     Lab Results  Component Value Date   WBC 5.1 08/15/2022   HGB 15.1 08/15/2022   HCT 45.9 08/15/2022   PLT 242 08/15/2022   GLUCOSE 101 (H) 08/15/2022   CHOL 193 08/15/2022   TRIG 92 08/15/2022   HDL 56 08/15/2022   LDLCALC 120 (H) 08/15/2022   ALT 18 08/15/2022   AST 15 08/15/2022   NA 141 08/15/2022   K 4.2 08/15/2022   CL 103  08/15/2022   CREATININE 1.01 08/15/2022   BUN 16 08/15/2022   CO2 23 08/15/2022   PSA 1.4 04/11/2019   HGBA1C 5.5 11/28/2020     Assessment & Plan:  Primary hypertension Assessment & Plan: Well-controlled.  Continue losartan.  Refilled today.  Orders: -     Losartan Potassium; Take 1 tablet (25 mg total) by mouth daily.  Dispense: 90 tablet; Refill: 3  Generalized anxiety disorder Assessment & Plan: Stable.  Alprazolam refilled.  Orders: -     ALPRAZolam; Take 0.5-1 tablets (0.25-0.5 mg total) by mouth 2 (two) times daily as needed for anxiety.  Dispense: 30 tablet; Refill: 5  Hyperlipidemia, unspecified hyperlipidemia type Assessment & Plan: Tolerating Crestor.  Continue.  Refilled.  Orders: -     Rosuvastatin Calcium; Take 1 tablet (10 mg total) by mouth daily.  Dispense: 90 tablet; Refill: 3   Follow-up:  Return in about 6 months (around 04/06/2024).  Everlene Other DO [facility]

## 2023-10-06 NOTE — Assessment & Plan Note (Signed)
Stable.  Alprazolam refilled.

## 2024-04-06 ENCOUNTER — Ambulatory Visit (INDEPENDENT_AMBULATORY_CARE_PROVIDER_SITE_OTHER): Admitting: Family Medicine

## 2024-04-06 VITALS — BP 134/81 | HR 78 | Ht 75.0 in | Wt 180.0 lb

## 2024-04-06 DIAGNOSIS — I10 Essential (primary) hypertension: Secondary | ICD-10-CM | POA: Diagnosis not present

## 2024-04-06 DIAGNOSIS — Z23 Encounter for immunization: Secondary | ICD-10-CM | POA: Diagnosis not present

## 2024-04-06 DIAGNOSIS — E785 Hyperlipidemia, unspecified: Secondary | ICD-10-CM

## 2024-04-06 DIAGNOSIS — Z125 Encounter for screening for malignant neoplasm of prostate: Secondary | ICD-10-CM

## 2024-04-06 DIAGNOSIS — Z130 Encounter for screening for diseases of the blood and blood-forming organs and certain disorders involving the immune mechanism: Secondary | ICD-10-CM

## 2024-04-06 DIAGNOSIS — F411 Generalized anxiety disorder: Secondary | ICD-10-CM | POA: Diagnosis not present

## 2024-04-06 MED ORDER — ROSUVASTATIN CALCIUM 10 MG PO TABS: 10.0000 mg | ORAL_TABLET | Freq: Every day | ORAL | 3 refills | Status: AC

## 2024-04-06 MED ORDER — LOSARTAN POTASSIUM 25 MG PO TABS: 25.0000 mg | ORAL_TABLET | Freq: Every day | ORAL | 3 refills | Status: AC

## 2024-04-06 MED ORDER — ALPRAZOLAM 0.5 MG PO TABS: 0.2500 mg | ORAL_TABLET | Freq: Two times a day (BID) | ORAL | 5 refills | Status: AC | PRN

## 2024-04-06 NOTE — Patient Instructions (Signed)
Labs today.  Medications refilled.  Follow up in 6 months. 

## 2024-04-07 ENCOUNTER — Ambulatory Visit: Payer: Self-pay | Admitting: Family Medicine

## 2024-04-07 LAB — CBC
Hematocrit: 44.6 % (ref 37.5–51.0)
Hemoglobin: 14.7 g/dL (ref 13.0–17.7)
MCH: 30.8 pg (ref 26.6–33.0)
MCHC: 33 g/dL (ref 31.5–35.7)
MCV: 94 fL (ref 79–97)
Platelets: 240 x10E3/uL (ref 150–450)
RBC: 4.77 x10E6/uL (ref 4.14–5.80)
RDW: 12.2 % (ref 11.6–15.4)
WBC: 5.7 x10E3/uL (ref 3.4–10.8)

## 2024-04-07 LAB — LIPID PANEL
Chol/HDL Ratio: 2.8 ratio (ref 0.0–5.0)
Cholesterol, Total: 132 mg/dL (ref 100–199)
HDL: 48 mg/dL (ref >39)
LDL Chol Calc (NIH): 69 mg/dL (ref 0–99)
Triglycerides: 74 mg/dL (ref 0–149)
VLDL Cholesterol Cal: 15 mg/dL (ref 5–40)

## 2024-04-07 LAB — CMP14+EGFR
ALT: 18 IU/L (ref 0–44)
AST: 19 IU/L (ref 0–40)
Albumin: 4.6 g/dL (ref 3.8–4.9)
Alkaline Phosphatase: 67 IU/L (ref 47–123)
BUN/Creatinine Ratio: 11 (ref 9–20)
BUN: 11 mg/dL (ref 6–24)
Bilirubin Total: 0.5 mg/dL (ref 0.0–1.2)
CO2: 25 mmol/L (ref 20–29)
Calcium: 9.5 mg/dL (ref 8.7–10.2)
Chloride: 101 mmol/L (ref 96–106)
Creatinine, Ser: 1 mg/dL (ref 0.76–1.27)
Globulin, Total: 2.1 g/dL (ref 1.5–4.5)
Glucose: 88 mg/dL (ref 70–99)
Potassium: 4.4 mmol/L (ref 3.5–5.2)
Sodium: 139 mmol/L (ref 134–144)
Total Protein: 6.7 g/dL (ref 6.0–8.5)
eGFR: 89 mL/min/1.73 (ref >59)

## 2024-04-07 LAB — PSA: Prostate Specific Ag, Serum: 3.7 ng/mL (ref 0.0–4.0)

## 2024-04-07 NOTE — Assessment & Plan Note (Signed)
LDL returned at goal.  Continue Crestor.

## 2024-04-07 NOTE — Progress Notes (Signed)
 Subjective:  Patient ID: Mathew Larson, male    DOB: 1966/04/03  Age: 58 y.o. MRN: [id number]  CC:   Follow up  HPI:  58 year old male presents for follow up.  Patient reports increased stress/anxiety recently (work related). 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  He uses Xanax  as needed for anxiety. Works well for him.  BP stable on Losartan .  Desires flu shot today.  Tolerating Crestor . Needs lipid panel.  Patient Active Problem List   Diagnosis Date Noted   Hyperlipidemia 01/28/2023   Generalized anxiety disorder 09/10/2016   Hypertension 10/20/2012    Social Hx   Social History   Socioeconomic History   Marital status: Married    Spouse name: Not on file   Number of children: Not on file   Years of education: Not on file   Highest education level: Bachelor's degree (e.g., BA, AB, BS)  Occupational History   Not on file  Tobacco Use   Smoking status: Never   Smokeless tobacco: Never  Substance and Sexual Activity   Alcohol use: Never   Drug use: No   Sexual activity: Yes  Other Topics Concern   Not on file  Social History Narrative   Not on file   Social Drivers of Health   Financial Resource Strain: Low Risk  (04/06/2024)   Overall Financial Resource Strain (CARDIA)    Difficulty of Paying Living Expenses: Not hard at all  Food Insecurity: No Food Insecurity (04/06/2024)   Hunger Vital Sign    Worried About Running Out of Food in the Last Year: Never true    Ran Out of Food in the Last Year: Never true  Transportation Needs: No Transportation Needs (04/06/2024)   PRAPARE - Administrator, Civil Service (Medical): No    Lack of Transportation (Non-Medical): No  Physical Activity: Sufficiently Active (04/06/2024)   Exercise Vital Sign    Days of Exercise per Week: 3 days    Minutes of Exercise per Session: 90 min  Stress: Stress Concern Present (04/06/2024)   [employer] of Occupational Health -  Occupational Stress Questionnaire    Feeling of Stress: Very much  Social Connections: Socially Integrated (04/06/2024)   Social Connection and Isolation Panel    Frequency of Communication with Friends and Family: More than three times a week    Frequency of Social Gatherings with Friends and Family: Once a week    Attends Religious Services: More than 4 times per year    Active Member of [employer] or Organizations: Yes    Attends Engineer, structural: More than 4 times per year    Marital Status: Married    Review of Systems Per HPI  Objective:  BP 134/81   Pulse 78   Ht 6' 3 (1.905 m)   Wt 180 lb (81.6 kg)   BMI 22.50 kg/m      04/06/2024    2:17 PM 10/06/2023    1:46 PM 01/28/2023   11:33 AM  BP/Weight  Systolic BP 134 110 151  Diastolic BP 81 73 92  Wt. (Lbs) 180 193.4 190.6  BMI 22.5 kg/m2 24.17 kg/m2 23.82 kg/m2    Physical Exam Vitals and nursing note reviewed.  Constitutional:      General: He is not in acute distress.    Appearance: Normal appearance.  HENT:     Head: Normocephalic and atraumatic.  Eyes:     General:        Right  eye: No discharge.        Left eye: No discharge.     Conjunctiva/sclera: Conjunctivae normal.  Cardiovascular:     Rate and Rhythm: Normal rate and regular rhythm.  Pulmonary:     Effort: Pulmonary effort is normal.     Breath sounds: Normal breath sounds. No wheezing, rhonchi or rales.  Neurological:     Mental Status: He is alert.  Psychiatric:        Mood and Affect: Mood normal.        Behavior: Behavior normal.     Lab Results  Component Value Date   WBC 5.7 04/06/2024   HGB 14.7 04/06/2024   HCT 44.6 04/06/2024   PLT 240 04/06/2024   GLUCOSE 88 04/06/2024   CHOL 132 04/06/2024   TRIG 74 04/06/2024   HDL 48 04/06/2024   LDLCALC 69 04/06/2024   ALT 18 04/06/2024   AST 19 04/06/2024   NA 139 04/06/2024   K 4.4 04/06/2024   CL 101 04/06/2024   CREATININE 1.00 04/06/2024   BUN 11 04/06/2024   CO2 25  04/06/2024   PSA 1.4 04/11/2019   HGBA1C 5.5 11/28/2020     Assessment & Plan:  Generalized anxiety disorder Assessment & Plan: Stable. Alprazolam  refilled.  Orders: -     ALPRAZolam ; Take 0.5-1 tablets (0.25-0.5 mg total) by mouth 2 (two) times daily as needed for anxiety.  Dispense: 30 tablet; Refill: 5  Hyperlipidemia, unspecified hyperlipidemia type Assessment & Plan: LDL returned at goal. Continue Crestor .  Orders: -     Rosuvastatin  Calcium ; Take 1 tablet (10 mg total) by mouth daily.  Dispense: 90 tablet; Refill: 3 -     Lipid panel  Primary hypertension Assessment & Plan: Stable. Continue Losartan .  Orders: -     Losartan  Potassium; Take 1 tablet (25 mg total) by mouth daily.  Dispense: 90 tablet; Refill: 3 -     CMP14+EGFR  Screening for deficiency anemia -     CBC  Screening PSA (prostate specific antigen) -     PSA  Encounter for immunization -     Flu vaccine trivalent PF, 6mos and older(Flulaval,Afluria,Fluarix,Fluzone)    Follow-up:  6 months  Geraldyn Shain Bluford DO [facility]

## 2024-04-07 NOTE — Assessment & Plan Note (Signed)
 Stable. Continue Losartan.

## 2024-04-07 NOTE — Assessment & Plan Note (Signed)
Stable.  Alprazolam refilled.

## 2024-07-25 ENCOUNTER — Encounter: Payer: Self-pay | Admitting: Family Medicine

## 2024-10-05 ENCOUNTER — Ambulatory Visit: Admitting: Family Medicine
